# Patient Record
Sex: Female | Born: 1950 | Race: Black or African American | Hispanic: No | Marital: Single | Smoking: Never smoker
Health system: Southern US, Community
[De-identification: ages and names within clinical notes are randomized; demographics above are authoritative.]

## PROBLEM LIST (undated history)

## (undated) DIAGNOSIS — I10 Essential (primary) hypertension: Secondary | ICD-10-CM

## (undated) DIAGNOSIS — E119 Type 2 diabetes mellitus without complications: Secondary | ICD-10-CM

## (undated) HISTORY — PX: WISDOM TOOTH EXTRACTION: SHX21

## (undated) HISTORY — DX: Essential (primary) hypertension: I10

## (undated) HISTORY — PX: VAGINAL HYSTERECTOMY: SUR661

## (undated) HISTORY — PX: FOOT SURGERY: SHX648

## (undated) HISTORY — DX: Type 2 diabetes mellitus without complications: E11.9

---

## 2011-12-16 DIAGNOSIS — I10 Essential (primary) hypertension: Secondary | ICD-10-CM | POA: Insufficient documentation

## 2011-12-16 DIAGNOSIS — E119 Type 2 diabetes mellitus without complications: Secondary | ICD-10-CM | POA: Insufficient documentation

## 2012-06-15 ENCOUNTER — Encounter: Payer: BC Managed Care – PPO | Admitting: Obstetrics and Gynecology

## 2012-07-19 ENCOUNTER — Ambulatory Visit: Payer: BC Managed Care – PPO | Admitting: Obstetrics and Gynecology

## 2012-07-19 ENCOUNTER — Encounter: Payer: Self-pay | Admitting: Obstetrics and Gynecology

## 2012-07-19 VITALS — BP 122/62 | Ht 66.0 in | Wt 128.0 lb

## 2012-07-19 DIAGNOSIS — E559 Vitamin D deficiency, unspecified: Secondary | ICD-10-CM | POA: Insufficient documentation

## 2012-07-19 DIAGNOSIS — Z124 Encounter for screening for malignant neoplasm of cervix: Secondary | ICD-10-CM

## 2012-07-19 DIAGNOSIS — Z139 Encounter for screening, unspecified: Secondary | ICD-10-CM

## 2012-07-19 LAB — VITAMIN D 25 HYDROXY (VIT D DEFICIENCY, FRACTURES): Vit D, 25-Hydroxy: 26 ng/mL — ABNORMAL LOW (ref 30–89)

## 2012-07-19 NOTE — Progress Notes (Signed)
Contraception Hysterectomy Last pap per pt 10/01/2010 Normal Last Mammo  02/12/2010  Normal Last Colonoscopy per pt 10/2010 Last Dexa Scan  2011 was told Vitamin D Deficiency  Primary MD none  Abuse at Home No    Filed Vitals:   07/19/12 0918  BP: 122/62   ROS: noncontributory  Physical Examination: General appearance - alert, well appearing, and in no distress, many moles Neck - supple, no significant adenopathy Chest - clear to auscultation, no wheezes, rales or rhonchi, symmetric air entry Heart - normal rate and regular rhythm Abdomen - soft, nontender, nondistended, no masses or organomegaly Breasts - breasts appear normal, no suspicious masses, no skin or nipple changes or axillary nodes Pelvic - normal external genitalia, vulva, atrophic vagina and adnexa, slight d/c Back exam - no CVAT Rectal - no masses Extremities - no edema, redness or tenderness in the calves or thighs  A/P mammo Dexa - today Check vit D Pap today - no further paps needed (reports hyst for benign reasons) Pt follows her moles and instructions given as well for any changes Refer to Dr. Allyne Gee - Pt needs a Prim MD Wet prep neg

## 2012-08-04 ENCOUNTER — Telehealth: Payer: Self-pay

## 2012-08-26 ENCOUNTER — Ambulatory Visit: Payer: Self-pay | Admitting: Internal Medicine

## 2013-05-20 ENCOUNTER — Other Ambulatory Visit: Payer: Self-pay | Admitting: Internal Medicine

## 2013-05-20 DIAGNOSIS — M5430 Sciatica, unspecified side: Secondary | ICD-10-CM

## 2013-05-30 ENCOUNTER — Other Ambulatory Visit: Payer: Self-pay

## 2013-06-06 ENCOUNTER — Ambulatory Visit
Admission: RE | Admit: 2013-06-06 | Discharge: 2013-06-06 | Disposition: A | Discharge status: Home / Self Care | Payer: BC Managed Care – PPO | Source: Ambulatory Visit | Attending: Internal Medicine | Admitting: Internal Medicine

## 2013-06-06 DIAGNOSIS — M5430 Sciatica, unspecified side: Secondary | ICD-10-CM

## 2013-07-28 ENCOUNTER — Other Ambulatory Visit: Payer: Self-pay | Admitting: Obstetrics and Gynecology

## 2013-08-03 ENCOUNTER — Ambulatory Visit (HOSPITAL_BASED_OUTPATIENT_CLINIC_OR_DEPARTMENT_OTHER)
Admission: RE | Admit: 2013-08-03 | Discharge: 2013-08-03 | Disposition: A | Discharge status: Home / Self Care | Payer: BC Managed Care – PPO | Source: Ambulatory Visit | Attending: Obstetrics and Gynecology | Admitting: Obstetrics and Gynecology

## 2013-08-03 DIAGNOSIS — Z139 Encounter for screening, unspecified: Secondary | ICD-10-CM

## 2013-08-03 DIAGNOSIS — Z1231 Encounter for screening mammogram for malignant neoplasm of breast: Secondary | ICD-10-CM | POA: Insufficient documentation

## 2013-09-29 ENCOUNTER — Ambulatory Visit (INDEPENDENT_AMBULATORY_CARE_PROVIDER_SITE_OTHER): Payer: BC Managed Care – PPO | Admitting: General Surgery

## 2013-10-05 ENCOUNTER — Encounter (HOSPITAL_BASED_OUTPATIENT_CLINIC_OR_DEPARTMENT_OTHER): Payer: Self-pay | Admitting: Emergency Medicine

## 2013-10-05 ENCOUNTER — Emergency Department (HOSPITAL_BASED_OUTPATIENT_CLINIC_OR_DEPARTMENT_OTHER)
Admission: EM | Admit: 2013-10-05 | Discharge: 2013-10-05 | Disposition: A | Discharge status: Home / Self Care | Payer: BC Managed Care – PPO | Attending: Emergency Medicine | Admitting: Emergency Medicine

## 2013-10-05 DIAGNOSIS — Z79899 Other long term (current) drug therapy: Secondary | ICD-10-CM | POA: Insufficient documentation

## 2013-10-05 DIAGNOSIS — N390 Urinary tract infection, site not specified: Secondary | ICD-10-CM | POA: Insufficient documentation

## 2013-10-05 DIAGNOSIS — R55 Syncope and collapse: Secondary | ICD-10-CM | POA: Insufficient documentation

## 2013-10-05 DIAGNOSIS — E119 Type 2 diabetes mellitus without complications: Secondary | ICD-10-CM | POA: Insufficient documentation

## 2013-10-05 DIAGNOSIS — I10 Essential (primary) hypertension: Secondary | ICD-10-CM | POA: Insufficient documentation

## 2013-10-05 DIAGNOSIS — Z7982 Long term (current) use of aspirin: Secondary | ICD-10-CM | POA: Insufficient documentation

## 2013-10-05 LAB — URINALYSIS, ROUTINE W REFLEX MICROSCOPIC
Bilirubin Urine: NEGATIVE
GLUCOSE, UA: NEGATIVE mg/dL
Hgb urine dipstick: NEGATIVE
Ketones, ur: NEGATIVE mg/dL
Nitrite: NEGATIVE
PH: 7.5 (ref 5.0–8.0)
Protein, ur: NEGATIVE mg/dL
Specific Gravity, Urine: 1.015 (ref 1.005–1.030)
Urobilinogen, UA: 1 mg/dL (ref 0.0–1.0)

## 2013-10-05 LAB — CBC WITH DIFFERENTIAL/PLATELET
BASOS ABS: 0 10*3/uL (ref 0.0–0.1)
BASOS PCT: 0 % (ref 0–1)
Eosinophils Absolute: 0.1 10*3/uL (ref 0.0–0.7)
Eosinophils Relative: 1 % (ref 0–5)
HCT: 39.4 % (ref 36.0–46.0)
Hemoglobin: 13.2 g/dL (ref 12.0–15.0)
LYMPHS PCT: 21 % (ref 12–46)
Lymphs Abs: 1 10*3/uL (ref 0.7–4.0)
MCH: 29.1 pg (ref 26.0–34.0)
MCHC: 33.5 g/dL (ref 30.0–36.0)
MCV: 86.8 fL (ref 78.0–100.0)
Monocytes Absolute: 0.5 10*3/uL (ref 0.1–1.0)
Monocytes Relative: 11 % (ref 3–12)
NEUTROS PCT: 67 % (ref 43–77)
Neutro Abs: 3 10*3/uL (ref 1.7–7.7)
Platelets: 129 10*3/uL — ABNORMAL LOW (ref 150–400)
RBC: 4.54 MIL/uL (ref 3.87–5.11)
RDW: 15.2 % (ref 11.5–15.5)
WBC: 4.5 10*3/uL (ref 4.0–10.5)

## 2013-10-05 LAB — BASIC METABOLIC PANEL
BUN: 14 mg/dL (ref 6–23)
CALCIUM: 9.4 mg/dL (ref 8.4–10.5)
CO2: 25 mEq/L (ref 19–32)
CREATININE: 0.9 mg/dL (ref 0.50–1.10)
Chloride: 99 mEq/L (ref 96–112)
GFR, EST AFRICAN AMERICAN: 78 mL/min — AB (ref >90)
GFR, EST NON AFRICAN AMERICAN: 67 mL/min — AB (ref >90)
Glucose, Bld: 250 mg/dL — ABNORMAL HIGH (ref 70–99)
Potassium: 4.2 mEq/L (ref 3.7–5.3)
Sodium: 137 mEq/L (ref 137–147)

## 2013-10-05 LAB — CBG MONITORING, ED: Glucose-Capillary: 226 mg/dL — ABNORMAL HIGH (ref 70–99)

## 2013-10-05 LAB — URINE MICROSCOPIC-ADD ON

## 2013-10-05 MED ORDER — SULFAMETHOXAZOLE-TRIMETHOPRIM 800-160 MG PO TABS: 1.0000 | ORAL_TABLET | Freq: Two times a day (BID) | ORAL | Status: DC

## 2013-10-05 MED ORDER — SODIUM CHLORIDE 0.9 % IV BOLUS (SEPSIS)
1000.0000 mL | Freq: Once | INTRAVENOUS | Status: AC
  Administered 2013-10-05: 1000 mL via INTRAVENOUS

## 2013-10-05 MED ORDER — SULFAMETHOXAZOLE-TMP DS 800-160 MG PO TABS
1.0000 | ORAL_TABLET | Freq: Once | ORAL | Status: AC
  Administered 2013-10-05: 1 via ORAL
  Filled 2013-10-05: qty 1

## 2013-10-05 NOTE — ED Notes (Signed)
Pt recently dx with sinus infection, started on tussionex yesterday, no appetite, abdominal pain "hurts all over" reports that this am she awoke around 0200 with dizziness, got up to go to bathroom, last balance and fell, family member heard fall and came to help

## 2013-10-05 NOTE — ED Provider Notes (Signed)
CSN: [id number]     Arrival date & time 10/05/13  0425 History   First MD Initiated Contact with Patient 10/05/13 [phone]     Chief Complaint  Patient presents with  . Syncope      (Consider location/radiation/quality/duration/timing/severity/associated sxs/prior Treatment) HPI This is a 63 year old female with history of diabetes. She was seen by her doctor yesterday for cough and nasal congestion. She was prescribed Tussionex. She took her first dose of Tussionex yesterday evening about 9 PM. Her appetite has been poor the only thing she ate yesterday was 2 cucumbers. She got up this morning to go to the bathroom and her family heard her fall; family member states this was about 3:45AM. The patient thinks she fell twice and ended up leaning against the wall. She denies injury. Her family states that she was clammy, and when she came to she felt dizzy and was hurting all over. She states these symptoms are consistent with previous episodes of hypoglycemia. She was given some peanut butter crackers to eat. Her family checked her sugar about 10 minutes later and found it to be 178. On arrival here she was complaining of continued dizziness, nausea, generalized aches and abdominal pain. By the time of my evaluation these symptoms had resolved and she complains only of feeling tired. Her sugar was noted to be 226 on arrival. Her systolic blood pressure did drop from the 140s to the 100s on standing.   Past Medical History  Diagnosis Date  . Hypertension   . Diabetes mellitus without complication    Past Surgical History  Procedure Laterality Date  . Wisdom tooth extraction    . Foot surgery    . Vaginal hysterectomy     Family History  Problem Relation Age of Onset  . Diabetes Father   . Heart failure Father   . Hypertension Father    History  Substance Use Topics  . Smoking status: Never Smoker   . Smokeless tobacco: Never Used  . Alcohol Use: 0.6 oz/week    1 Glasses of wine per week      Comment: 1 glass of wine    OB History   Grav Para Term Preterm Abortions TAB SAB Ect Mult Living   2 2 2       2      Review of Systems  All other systems reviewed and are negative.   Allergies  Review of patient's allergies indicates no known allergies.  Home Medications   Prior to Admission medications   Medication Sig Start Date End Date Taking? Authorizing Provider  aspirin 81 MG tablet Take 81 mg by mouth daily.   Yes Historical Provider, MD  metoprolol succinate (TOPROL-XL) 25 MG 24 hr tablet Take 25 mg by mouth daily.   Yes Historical Provider, MD  Multiple Vitamin (MULTIVITAMIN) tablet Take 1 tablet by mouth daily.   Yes Historical Provider, MD  pioglitazone (ACTOS) 15 MG tablet Take 15 mg by mouth daily.   Yes Historical Provider, MD  sitaGLIPtin (JANUVIA) 100 MG tablet Take 100 mg by mouth daily.   Yes Historical Provider, MD   BP 107/67  Pulse 72  Temp(Src) 98.5 F (36.9 C) (Oral)  Resp 20  Ht 5\' 7"  (1.702 m)  Wt 128 lb (58.06 kg)  BMI 20.04 kg/m2  SpO2 99%  Physical Exam General: Well-developed, well-nourished female in no acute distress; appearance consistent with age of record HENT: normocephalic; atraumatic Eyes: pupils equal, round and reactive to light; extraocular muscles intact Neck:  supple Heart: regular rate and rhythm Lungs: clear to auscultation bilaterally Abdomen: soft; nondistended; nontender; no masses or hepatosplenomegaly; bowel sounds present Extremities: No deformity; full range of motion; pulses normal; no Neurologic: Awake, alert and oriented; motor function intact in all extremities and symmetric; no facial droop Skin: Warm and dry Psychiatric: Normal mood and affect    ED Course  Procedures (including critical care time)   MDM   Nursing notes and vitals signs, including pulse oximetry, reviewed.  Summary of this visit's results, reviewed by myself:  Labs:  Results for orders placed during the hospital encounter of  10/05/13 (from the past 24 hour(s))  CBG MONITORING, ED     Status: Abnormal   Collection Time    10/05/13  4:36 AM      Result Value Ref Range   Glucose-Capillary 226 (*) 70 - 99 mg/dL  URINALYSIS, ROUTINE W REFLEX MICROSCOPIC     Status: Abnormal   Collection Time    10/05/13  4:40 AM      Result Value Ref Range   Color, Urine YELLOW  YELLOW   APPearance CLOUDY (*) CLEAR   Specific Gravity, Urine 1.015  1.005 - 1.030   pH 7.5  5.0 - 8.0   Glucose, UA NEGATIVE  NEGATIVE mg/dL   Hgb urine dipstick NEGATIVE  NEGATIVE   Bilirubin Urine NEGATIVE  NEGATIVE   Ketones, ur NEGATIVE  NEGATIVE mg/dL   Protein, ur NEGATIVE  NEGATIVE mg/dL   Urobilinogen, UA 1.0  0.0 - 1.0 mg/dL   Nitrite NEGATIVE  NEGATIVE   Leukocytes, UA MODERATE (*) NEGATIVE  URINE MICROSCOPIC-ADD ON     Status: Abnormal   Collection Time    10/05/13  4:40 AM      Result Value Ref Range   Squamous Epithelial / LPF FEW (*) RARE   WBC, UA 7-10  <3 WBC/hpf   RBC / HPF 0-2  <3 RBC/hpf   Bacteria, UA FEW (*) RARE   Urine-Other AMORPHOUS URATES/PHOSPHATES    BASIC METABOLIC PANEL     Status: Abnormal   Collection Time    10/05/13  5:18 AM      Result Value Ref Range   Sodium 137  137 - 147 mEq/L   Potassium 4.2  3.7 - 5.3 mEq/L   Chloride 99  96 - 112 mEq/L   CO2 25  19 - 32 mEq/L   Glucose, Bld 250 (*) 70 - 99 mg/dL   BUN 14  6 - 23 mg/dL   Creatinine, Ser 0.90  0.50 - 1.10 mg/dL   Calcium 9.4  8.4 - 10.5 mg/dL   GFR calc non Af Amer 67 (*) >90 mL/min   GFR calc Af Amer 78 (*) >90 mL/min  CBC WITH DIFFERENTIAL     Status: Abnormal   Collection Time    10/05/13  5:18 AM      Result Value Ref Range   WBC 4.5  4.0 - 10.5 K/uL   RBC 4.54  3.87 - 5.11 MIL/uL   Hemoglobin 13.2  12.0 - 15.0 g/dL   HCT 39.4  36.0 - 46.0 %   MCV 86.8  78.0 - 100.0 fL   MCH 29.1  26.0 - 34.0 pg   MCHC 33.5  30.0 - 36.0 g/dL   RDW 15.2  11.5 - 15.5 %   Platelets 129 (*) 150 - 400 K/uL   Neutrophils Relative % 67  43 - 77 %    Neutro Abs 3.0  1.7 -  7.7 K/uL   Lymphocytes Relative 21  12 - 46 %   Lymphs Abs 1.0  0.7 - 4.0 K/uL   Monocytes Relative 11  3 - 12 %   Monocytes Absolute 0.5  0.1 - 1.0 K/uL   Eosinophils Relative 1  0 - 5 %   Eosinophils Absolute 0.1  0.0 - 0.7 K/uL   Basophils Relative 0  0 - 1 %   Basophils Absolute 0.0  0.0 - 0.1 K/uL   5:55 AM Patient feeling much better after IV fluid bolus. She is able to ambulate around the department without weakness or dizziness. Suspect syncopal episode was due to hypoglycemia as the patient ate little yesterday but did take her antihyperglycemic medications. Symptoms have resolved, EKG and labs normal except for Glucose. She was advised to eat breakfast after discharge and return to a normal diet as best she can. She is due to leave on a cruise to [place] in two days. We will treat her UTI.     Wynetta Fines, MD 10/05/13 [phone]

## 2013-10-06 LAB — URINE CULTURE: Colony Count: 15000

## 2013-10-07 ENCOUNTER — Encounter (HOSPITAL_BASED_OUTPATIENT_CLINIC_OR_DEPARTMENT_OTHER): Payer: Self-pay | Admitting: Emergency Medicine

## 2013-10-07 ENCOUNTER — Emergency Department (HOSPITAL_BASED_OUTPATIENT_CLINIC_OR_DEPARTMENT_OTHER)
Admission: EM | Admit: 2013-10-07 | Discharge: 2013-10-07 | Disposition: A | Discharge status: Home / Self Care | Payer: BC Managed Care – PPO | Attending: Emergency Medicine | Admitting: Emergency Medicine

## 2013-10-07 ENCOUNTER — Emergency Department (HOSPITAL_BASED_OUTPATIENT_CLINIC_OR_DEPARTMENT_OTHER): Payer: BC Managed Care – PPO

## 2013-10-07 DIAGNOSIS — Z7982 Long term (current) use of aspirin: Secondary | ICD-10-CM | POA: Insufficient documentation

## 2013-10-07 DIAGNOSIS — I10 Essential (primary) hypertension: Secondary | ICD-10-CM | POA: Insufficient documentation

## 2013-10-07 DIAGNOSIS — J209 Acute bronchitis, unspecified: Secondary | ICD-10-CM | POA: Insufficient documentation

## 2013-10-07 DIAGNOSIS — E119 Type 2 diabetes mellitus without complications: Secondary | ICD-10-CM | POA: Insufficient documentation

## 2013-10-07 DIAGNOSIS — Z79899 Other long term (current) drug therapy: Secondary | ICD-10-CM | POA: Insufficient documentation

## 2013-10-07 DIAGNOSIS — J40 Bronchitis, not specified as acute or chronic: Secondary | ICD-10-CM

## 2013-10-07 MED ORDER — BENZONATATE 100 MG PO CAPS: 100.0000 mg | ORAL_CAPSULE | Freq: Three times a day (TID) | ORAL | Status: DC

## 2013-10-07 MED ORDER — ALBUTEROL SULFATE HFA 108 (90 BASE) MCG/ACT IN AERS: 1.0000 | INHALATION_SPRAY | Freq: Four times a day (QID) | RESPIRATORY_TRACT | Status: DC | PRN

## 2013-10-07 MED ORDER — AZITHROMYCIN 250 MG PO TABS: ORAL_TABLET | ORAL | Status: DC

## 2013-10-07 MED ORDER — PREDNISONE 50 MG PO TABS
60.0000 mg | ORAL_TABLET | Freq: Once | ORAL | Status: AC
  Administered 2013-10-07: 60 mg via ORAL
  Filled 2013-10-07 (×2): qty 1

## 2013-10-07 MED ORDER — PREDNISONE 10 MG PO TABS: 20.0000 mg | ORAL_TABLET | Freq: Every day | ORAL | Status: DC

## 2013-10-07 NOTE — Discharge Instructions (Signed)

## 2013-10-07 NOTE — ED Provider Notes (Signed)
CSN: [id number]     Arrival date & time 10/07/13  2021 History  This chart was scribed for Leota Jacobsen, MD by Maree Erie, ED Scribe. The patient was seen in room MH08/MH08. Patient's care was started at 9:50 PM.     Chief Complaint  Patient presents with  . Cough      Patient is a 63 y.o. female presenting with cough. The history is provided by the patient. No language interpreter was used.  Cough Associated symptoms: no fever     HPI Comments: Amy Foley is a 63 y.o. female who presents to the Emergency Department complaining of a constant cough that began about a week ago. She states that the cough became productive with yellow sputum today. She was seen Tuesday for the cough and was given Tussinex, which she has been taking without relief. She reports sick contacts in her family that just got over similar symptoms.  She denies vomiting or diarrhea. Sx persistent and nothing makes them better or worse   Past Medical History  Diagnosis Date  . Hypertension   . Diabetes mellitus without complication    Past Surgical History  Procedure Laterality Date  . Wisdom tooth extraction    . Foot surgery    . Vaginal hysterectomy     Family History  Problem Relation Age of Onset  . Diabetes Father   . Heart failure Father   . Hypertension Father    History  Substance Use Topics  . Smoking status: Never Smoker   . Smokeless tobacco: Never Used  . Alcohol Use: 0.6 oz/week    1 Glasses of wine per week     Comment: 1 glass of wine    OB History   Grav Para Term Preterm Abortions TAB SAB Ect Mult Living   2 2 2       2      Review of Systems  Constitutional: Negative for fever.  Respiratory: Positive for cough.   All other systems reviewed and are negative.     Allergies  Tussionex pennkinetic er  Home Medications   Prior to Admission medications   Medication Sig Start Date End Date Taking? Authorizing Provider  aspirin 81 MG tablet Take 81 mg by  mouth daily.    Historical Provider, MD  metoprolol succinate (TOPROL-XL) 25 MG 24 hr tablet Take 25 mg by mouth daily.    Historical Provider, MD  Multiple Vitamin (MULTIVITAMIN) tablet Take 1 tablet by mouth daily.    Historical Provider, MD  pioglitazone (ACTOS) 15 MG tablet Take 15 mg by mouth daily.    Historical Provider, MD  sitaGLIPtin (JANUVIA) 100 MG tablet Take 100 mg by mouth daily.    Historical Provider, MD  sulfamethoxazole-trimethoprim (SEPTRA DS) 800-160 MG per tablet Take 1 tablet by mouth every 12 (twelve) hours. 10/05/13   Karen Chafe Molpus, MD   Triage Vitals: BP 154/83  Pulse 79  Temp(Src) 97.8 F (36.6 C) (Oral)  Resp 20  Ht 5\' 7"  (1.702 m)  Wt 126 lb (57.153 kg)  BMI 19.73 kg/m2  SpO2 100%  Physical Exam  Nursing note and vitals reviewed. Constitutional: She is oriented to person, place, and time. She appears well-developed and well-nourished.  Non-toxic appearance.  HENT:  Head: Normocephalic and atraumatic.  Eyes: Conjunctivae are normal. Pupils are equal, round, and reactive to light.  Neck: Normal range of motion.  Cardiovascular: Normal rate.   Pulmonary/Chest: No respiratory distress. She has no wheezes. She has no rales.  Increased end expiratory phase.   Neurological: She is alert and oriented to person, place, and time.  Skin: Skin is warm and dry.  Psychiatric: She has a normal mood and affect.    ED Course  Procedures (including critical care time)  DIAGNOSTIC STUDIES: Oxygen Saturation is 100% on room air, normal by my interpretation.    COORDINATION OF CARE: 9:51 PM - Patient verbalizes understanding and agrees with treatment plan.    Labs Review Labs Reviewed - No data to display  Imaging Review Dg Chest 2 View  10/07/2013   CLINICAL DATA:  Cough  EXAM: CHEST  2 VIEW  COMPARISON:  None.  FINDINGS: The lungs are clear. Heart size and pulmonary vascularity are normal. No pneumothorax. No adenopathy. No bone lesions.  IMPRESSION: No  abnormality noted.   Electronically Signed   By: Lowella Grip M.D.   On: 10/07/2013 20:43     EKG Interpretation None      MDM   Final diagnoses:  None    I personally performed the services described in this documentation, which was scribed in my presence. The recorded information has been reviewed and is accurate.     Leota Jacobsen, MD 10/07/13 2159

## 2013-10-07 NOTE — ED Notes (Signed)
Cough x 1 week. Productive with yellow sputum. She was seen by her MD Tuesday and given Tussinex.

## 2013-10-20 ENCOUNTER — Ambulatory Visit (INDEPENDENT_AMBULATORY_CARE_PROVIDER_SITE_OTHER): Payer: BC Managed Care – PPO | Admitting: General Surgery

## 2013-10-20 ENCOUNTER — Encounter (INDEPENDENT_AMBULATORY_CARE_PROVIDER_SITE_OTHER): Payer: Self-pay | Admitting: General Surgery

## 2013-10-20 VITALS — BP 126/78 | HR 67 | Temp 98.4°F | Ht 67.0 in | Wt 126.0 lb

## 2013-10-20 DIAGNOSIS — M7989 Other specified soft tissue disorders: Secondary | ICD-10-CM

## 2013-10-20 NOTE — Progress Notes (Signed)
Patient ID: Pamlea Foley, female   DOB: [birth year], 63 y.o.   MRN: [id number]  Chief Complaint  Patient presents with  . eval left axilla    HPI Amy Foley is a 63 y.o. female.  The patient is a 64 year old female who is referred by Dr. Delfina Redwood for evaluation of a left axillary fullness. The patient states that she recognizes that this has been there for approximately 5 years. She states that it has been unchanged. She states she had a full workup previously to include blood work as well as ultrasound and was found to be nonsignificant.  The patient denies any fevers, chills, night sweats, or weight loss.  HPI  Past Medical History  Diagnosis Date  . Hypertension   . Diabetes mellitus without complication     Past Surgical History  Procedure Laterality Date  . Wisdom tooth extraction    . Foot surgery    . Vaginal hysterectomy      Family History  Problem Relation Age of Onset  . Diabetes Father   . Heart failure Father   . Hypertension Father     Social History History  Substance Use Topics  . Smoking status: Never Smoker   . Smokeless tobacco: Never Used  . Alcohol Use: 0.6 oz/week    1 Glasses of wine per week     Comment: 1 glass of wine     Allergies  Allergen Reactions  . Tussionex Pennkinetic Er [Hydrocod Polst-Cpm Polst Er]     Current Outpatient Prescriptions  Medication Sig Dispense Refill  . albuterol (PROVENTIL HFA;VENTOLIN HFA) 108 (90 BASE) MCG/ACT inhaler Inhale 1-2 puffs into the lungs every 6 (six) hours as needed for wheezing or shortness of breath.  1 Inhaler  0  . aspirin 81 MG tablet Take 81 mg by mouth daily.      . benzonatate (TESSALON) 100 MG capsule Take 1 capsule (100 mg total) by mouth every 8 (eight) hours.  21 capsule  0  . metoprolol succinate (TOPROL-XL) 25 MG 24 hr tablet Take 25 mg by mouth daily.      . Multiple Vitamin (MULTIVITAMIN) tablet Take 1 tablet by mouth daily.      . pioglitazone (ACTOS) 15 MG  tablet Take 15 mg by mouth daily.      . sitaGLIPtin (JANUVIA) 100 MG tablet Take 100 mg by mouth daily.      Marland Kitchen sulfamethoxazole-trimethoprim (SEPTRA DS) 800-160 MG per tablet Take 1 tablet by mouth every 12 (twelve) hours.  10 tablet  0   No current facility-administered medications for this visit.    Review of Systems Review of Systems  Constitutional: Negative.  Negative for fever, chills, fatigue and unexpected weight change.  HENT: Negative.   Respiratory: Negative.   Cardiovascular: Negative.   Gastrointestinal: Negative.   Neurological: Negative.   All other systems reviewed and are negative.   Blood pressure 126/78, pulse 67, temperature 98.4 F (36.9 C), height 5\' 7"  (1.702 m), weight 126 lb (57.153 kg).  Physical Exam Physical Exam  Constitutional: She is oriented to person, place, and time. She appears well-developed and well-nourished.  HENT:  Head: Normocephalic and atraumatic.  Eyes: Conjunctivae and EOM are normal. Pupils are equal, round, and reactive to light.  Neck: Normal range of motion. Neck supple.  Cardiovascular: Normal rate, regular rhythm and normal heart sounds.   Pulmonary/Chest: Effort normal and breath sounds normal.  Abdominal: Soft. Bowel sounds are normal.  Musculoskeletal: Normal range of motion.  Lymphadenopathy:  She has axillary adenopathy (left, mobile, non ttp, ).  Neurological: She is alert and oriented to person, place, and time.  Skin: Skin is warm and dry.  Psychiatric: She has a normal mood and affect.    Data Reviewed none  Assessment    63 year old female with left axillary fullness, likely lymph nodes. The patient recently underwent a mammogram which was negative. The patient's had no weight loss and repeat type symptoms and I am less worried about any type of lymphoma on this patient. The patient's that she's been doing some weightlifting imposes potentially be related to that. The patient states she had an ultrasound  approximately 5 years ago and is to try to get the records of this. It would probably benefit the patient to have a followup ultrasound to continue to monitor the left axillary fullness.     Plan    1. The patient follow up as needed. 2. We can review the results with the patient faxes his records to [place] from the outside clinic. 3. Either myself or Dr. Delfina Redwood or I can order an ultrasound to compare to previous ultrasound, and the size of the axillary fullness.        Rosario Jacks., Armando 10/20/2013, 10:09 AM

## 2013-10-21 ENCOUNTER — Telehealth (INDEPENDENT_AMBULATORY_CARE_PROVIDER_SITE_OTHER): Payer: Self-pay

## 2013-10-21 NOTE — Telephone Encounter (Signed)
Office Notes from Dr. Rosendo Gros on 10/20/13 faxed to Dr. Delfina Redwood @ [phone].

## 2014-03-06 ENCOUNTER — Encounter (INDEPENDENT_AMBULATORY_CARE_PROVIDER_SITE_OTHER): Payer: Self-pay | Admitting: General Surgery

## 2014-04-14 ENCOUNTER — Encounter: Payer: Self-pay | Admitting: Podiatrist

## 2014-04-14 ENCOUNTER — Ambulatory Visit (INDEPENDENT_AMBULATORY_CARE_PROVIDER_SITE_OTHER): Payer: BC Managed Care – PPO | Admitting: Podiatrist

## 2014-04-14 VITALS — BP 122/61 | HR 80 | Resp 16

## 2014-04-14 DIAGNOSIS — Q828 Other specified congenital malformations of skin: Secondary | ICD-10-CM

## 2014-04-14 NOTE — Patient Instructions (Signed)
Salicylic Acid topical gel, cream, lotion, solution What is this medicine? SALICYCLIC ACID (SAL i SIL ik AS id) breaks down layers of thick skin. It is used to treat common and plantar warts, psoriasis, calluses, and corns. It is also used to treat or to prevent acne. This medicine may be used for other purposes; ask your health care provider or pharmacist if you have questions. COMMON BRAND NAME(S): [place] Jabs, Clear Away Liquid, Compound W, Corn/Callus Remover, Dermarest Psoriasis Moisturizer, Dermarest Psoriasis Overnight Treatment, Dermarest Psoriasis Scalp Treatment, Dermarest Psoriasis Skin Treatment, DuoFilm Wart Remover, [place]ofilm, Hydrisalic, Keralyt, Neutrogena Acne Wash, [place], RE SA, SalAC, [employer], [place], [place], [place], [place] 2 in 1 Anti-Dandruff, [place], [place], Wart-Off What should I tell my health care provider before I take this medicine? They need to know if you have any of these conditions: -child with chickenpox, the flu, or other viral infection -kidney disease -liver disease -an unusual or allergic reaction to salicylic acid, other medicines, foods, dyes, or preservatives -pregnant or trying to get pregnant -breast-feeding How should I use this medicine? This medicine is for external use only. Follow the directions on the label. Do not apply to raw or irritated skin. Avoid getting medicine in your eyes, lips, nose, mouth, or other sensitive areas. Use this medicine at regular intervals. Do not use more often than directed. Talk to your pediatrician regarding the use of this medicine in children. Special care may be needed. This medicine is not approved for use in children under 10 years old. Overdosage: If you think you have taken too much of this medicine contact a poison control center or emergency room at once. NOTE: This medicine is only for you. Do not share this medicine with others. What if I miss a dose? If you miss a dose, use it as soon as you  can. If it is almost time for your next dose, use only that dose. Do not use double or extra doses. What may interact with this medicine? -medicines that change urine pH like ammonium chloride, sodium bicarbonate, and others -medicines that treat or prevent blood clots like warfarin -methotrexate -pyrazinamide -some medicines for diabetes -some medicines for gout -steroid medicines like prednisone or cortisone This list may not describe all possible interactions. Give your health care provider a list of all the medicines, herbs, non-prescription drugs, or dietary supplements you use. Also tell them if you smoke, drink alcohol, or use illegal drugs. Some items may interact with your medicine. What should I watch for while using this medicine? Tell your doctor is your symptoms do not get better or if they get worse. This medicine can make you more sensitive to the sun. Keep out of the sun. If you cannot avoid being in the sun, wear protective clothing and use sunscreen. Do not use sun lamps or tanning beds/booths. Use of this medicine in children under 12 years or in patients with kidney or liver disease may increase the risk of serious side effects. These patients should not use this medicine over large areas of skin. If you notice symptoms such as nausea, vomiting, dizziness, loss of hearing, ringing in the ears, unusual weakness or tiredness, fast or labored breathing, diarrhea, or confusion, stop using this medicine and contact your doctor or health care professional. What side effects may I notice from receiving this medicine? Side effects that you should report to your doctor or health care professional as soon as possible: -allergic reactions like skin rash, itching or hives, swelling of the face,  lips, or tongue Side effects that usually do not require medical attention (report to your doctor or health care professional if they continue or are bothersome): -skin irritation This list may not  describe all possible side effects. Call your doctor for medical advice about side effects. You may report side effects to FDA at 1-800-FDA-1088. Where should I keep my medicine? Keep out of the reach of children. Store at room temperature between 15 and 30 degrees C (59 and 86 degrees F). Do not freeze. Throw away any unused medicine after the expiration date. NOTE: This sheet is a summary. It may not cover all possible information. If you have questions about this medicine, talk to your doctor, pharmacist, or health care provider.  2015, [employer]vier/Gold Standard. (2007-12-24 13:36:20)

## 2014-04-24 NOTE — Progress Notes (Signed)
Chief Complaint  Patient presents with  . Foot Pain    Plantar forefoot left   "I might have a plantar wart or callus"   LOV 2013     HPI: Patient is 63 y.o. female who presents today for a painful lesion at the forefoot of the left foot. Patient is diabetic and the area makes it difficult to walk   Allergies  Allergen Reactions  . Tussionex Pennkinetic Er [Hydrocod Polst-Cpm Polst Er]     Physical Exam  Patient is awake, alert, and oriented x 3.  In no acute distress.  Vascular status is intact with palpable pedal pulses at 2/4 DP and PT bilateral and capillary refill time within normal limits. Neurological sensation is also intact bilaterally via Semmes Weinstein monofilament at 5/5 sites. Light touch, vibratory sensation, Achilles tendon reflex is intact. Dermatological exam reveals skin color, turger and texture as normal. No open lesions present.  Musculature intact with dorsiflexion, plantarflexion, inversion, eversion.  Well circumscribed lesion is present to the plantar forefoot of the left foot. 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 No site of ulceration is present.  Assessment: Porokeratosis  Plan: Deep debridement of the porokeratotic lesion is accomplished today. It was packed with salinocaine and offloaded. She'll be seen back as needed for follow-up and will call if any concerns or problems arise.

## 2014-08-25 ENCOUNTER — Encounter: Payer: Self-pay | Admitting: Podiatrist

## 2014-08-25 ENCOUNTER — Ambulatory Visit (INDEPENDENT_AMBULATORY_CARE_PROVIDER_SITE_OTHER): Payer: BC Managed Care – PPO | Admitting: Podiatrist

## 2014-08-25 VITALS — BP 134/75 | HR 85 | Resp 12

## 2014-08-25 DIAGNOSIS — M216X2 Other acquired deformities of left foot: Secondary | ICD-10-CM | POA: Diagnosis not present

## 2014-08-25 DIAGNOSIS — Q828 Other specified congenital malformations of skin: Secondary | ICD-10-CM | POA: Diagnosis not present

## 2014-08-25 NOTE — Patient Instructions (Signed)
Corns and Calluses Corns are small areas of thickened skin that usually occur on the top, sides, or tip of a toe. They contain a cone-shaped core with a point that can press on a nerve below. This causes pain. Calluses are areas of thickened skin that usually develop on hands, fingers, palms, soles of the feet, and heels. These are areas that experience frequent friction or pressure. CAUSES  Corns are usually the result of rubbing (friction) or pressure from shoes that are too tight or do not fit properly. Calluses are caused by repeated friction and pressure on the affected areas. SYMPTOMS  A hard growth on the skin.  Pain or tenderness under the skin.  Sometimes, redness and swelling.  Increased discomfort while wearing tight-fitting shoes. DIAGNOSIS  Your caregiver can usually tell what the problem is by doing a physical exam. TREATMENT  Removing the cause of the friction or pressure is usually the only treatment needed. However, sometimes medicines can be used to help soften the hardened, thickened areas. These medicines include salicylic acid plasters and 12% ammonium lactate lotion. These medicines should only be used under the direction of your caregiver. HOME CARE INSTRUCTIONS   Try to remove pressure from the affected area.  You may wear donut-shaped corn pads to protect your skin.  You may use a pumice stone or nonmetallic nail file to gently reduce the thickness of a corn.  Wear properly fitted footwear.  If you have calluses on the hands, wear gloves during activities that cause friction.  If you have diabetes, you should regularly examine your feet. Tell your caregiver if you notice any problems with your feet. SEEK IMMEDIATE MEDICAL CARE IF:   You have increased pain, swelling, redness, or warmth in the affected area.  Your corn or callus starts to drain fluid or bleeds.  You are not getting better, even with treatment. Document Released: 01/26/2004 Document  Revised: 07/14/2011 Document Reviewed: 12/17/2010 Exit[employer] Patient Information 2015 [employer]. This information is not intended to replace advice given to you by your health care provider. Make sure you discuss any questions you have with your health care provider.  

## 2014-08-25 NOTE — Progress Notes (Signed)
Chief Complaint  Patient presents with  . Foot Pain    "left forefoot is still painful"     HPI: Patient is 64 y.o. female who presents today for a painful lesion at the forefoot of the left foot. Patient is diabetic and the area makes it difficult to walk   Allergies  Allergen Reactions  . Tussionex Pennkinetic Er [Hydrocod Polst-Cpm Polst Er]     Physical Exam  Patient is awake, alert, and oriented x 3.  In no acute distress.  Vascular status is intact with palpable pedal pulses at 2/4 DP and PT bilateral and capillary refill time within normal limits. Neurological sensation is also intact bilaterally via Semmes Weinstein monofilament at 5/5 sites. Light touch, vibratory sensation, Achilles tendon reflex is intact. Dermatological exam reveals skin color, turger and texture as normal. No open lesions present.  Musculature intact with dorsiflexion, plantarflexion, inversion, eversion.  Well circumscribed lesion is present to the plantar forefoot of the left foot. 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 No site of ulceration is present.  Assessment: Porokeratosis  Plan: Deep debridement of the porokeratotic lesion is accomplished today. It was packed with salinocaine and offloaded. She will call 3 weeks before she is intereste d in surgery-- likely she will need a shave or weil of the 3rd metatarsal. xrays will need to be taken

## 2015-04-26 ENCOUNTER — Ambulatory Visit (INDEPENDENT_AMBULATORY_CARE_PROVIDER_SITE_OTHER): Payer: BC Managed Care – PPO | Admitting: Podiatry

## 2015-04-26 ENCOUNTER — Encounter: Payer: Self-pay | Admitting: Podiatry

## 2015-04-26 VITALS — BP 109/61 | HR 77 | Resp 16

## 2015-04-26 DIAGNOSIS — Q828 Other specified congenital malformations of skin: Secondary | ICD-10-CM | POA: Diagnosis not present

## 2015-04-30 NOTE — Progress Notes (Signed)
She presents today with chief complaint of painful calluses bilateral foot. She states is becoming too painful to walk on. She last visited our practice 08/29/2014.  Objective: Vital signs are stable she is alert and oriented 3. Pulses are strongly palpable. Capillary fill time is within normal limits. Normal sensation is intact bilaterally no open lesions or wounds. Porokeratotic lesions are noted forefoot bilateral.  Assessment: Porokeratosis.  Plan: Deep debridement of porokeratotic lesion was accomplished today and I will follow up with her on an as-needed basis.

## 2015-07-06 ENCOUNTER — Other Ambulatory Visit: Payer: Self-pay | Admitting: Internal Medicine

## 2015-07-06 DIAGNOSIS — M7989 Other specified soft tissue disorders: Secondary | ICD-10-CM

## 2015-07-10 ENCOUNTER — Other Ambulatory Visit: Payer: BC Managed Care – PPO

## 2015-07-17 ENCOUNTER — Ambulatory Visit
Admission: RE | Admit: 2015-07-17 | Discharge: 2015-07-17 | Disposition: A | Discharge status: Home / Self Care | Payer: BC Managed Care – PPO | Source: Ambulatory Visit | Attending: Internal Medicine | Admitting: Internal Medicine

## 2015-07-17 DIAGNOSIS — M7989 Other specified soft tissue disorders: Secondary | ICD-10-CM

## 2015-10-25 ENCOUNTER — Ambulatory Visit (INDEPENDENT_AMBULATORY_CARE_PROVIDER_SITE_OTHER): Payer: BC Managed Care – PPO | Admitting: Podiatry

## 2015-10-25 ENCOUNTER — Encounter: Payer: Self-pay | Admitting: Podiatry

## 2015-10-25 DIAGNOSIS — Q828 Other specified congenital malformations of skin: Secondary | ICD-10-CM | POA: Diagnosis not present

## 2015-10-25 DIAGNOSIS — M216X2 Other acquired deformities of left foot: Secondary | ICD-10-CM

## 2015-10-25 NOTE — Progress Notes (Signed)
She presents today with chief complaint of painful calluses plantar aspect of the forefoot. She would also like to have her nails trimmed.  Objective: Vital signs are stable she is alert and oriented 3. Pulses are palpable. Multiple porokeratosis plantar aspect of the bilateral foot with elongated toenails.  Assessment: Pain in limb secondary to porokeratosis and elongated toenails.  Plan: Debridement of all reactive hyperkeratosis and toenails.

## 2016-04-05 ENCOUNTER — Encounter (HOSPITAL_BASED_OUTPATIENT_CLINIC_OR_DEPARTMENT_OTHER): Payer: Self-pay | Admitting: *Deleted

## 2016-04-05 ENCOUNTER — Emergency Department (HOSPITAL_BASED_OUTPATIENT_CLINIC_OR_DEPARTMENT_OTHER)
Admission: EM | Admit: 2016-04-05 | Discharge: 2016-04-05 | Disposition: A | Discharge status: Home / Self Care | Payer: Medicare Other | Attending: Emergency Medicine | Admitting: Emergency Medicine

## 2016-04-05 DIAGNOSIS — E119 Type 2 diabetes mellitus without complications: Secondary | ICD-10-CM | POA: Insufficient documentation

## 2016-04-05 DIAGNOSIS — K0889 Other specified disorders of teeth and supporting structures: Secondary | ICD-10-CM | POA: Insufficient documentation

## 2016-04-05 DIAGNOSIS — Z7982 Long term (current) use of aspirin: Secondary | ICD-10-CM | POA: Diagnosis not present

## 2016-04-05 DIAGNOSIS — I10 Essential (primary) hypertension: Secondary | ICD-10-CM | POA: Diagnosis not present

## 2016-04-05 DIAGNOSIS — Z79899 Other long term (current) drug therapy: Secondary | ICD-10-CM | POA: Diagnosis not present

## 2016-04-05 DIAGNOSIS — Z5321 Procedure and treatment not carried out due to patient leaving prior to being seen by health care provider: Secondary | ICD-10-CM | POA: Insufficient documentation

## 2016-04-05 NOTE — ED Notes (Signed)
RN called to lobby due to pt being at checkout desk. RN spoke with pt who stated she had waited too long and wanted to go home and "sleep it off". RN encouraged patient to stay and be seen. Pt refused.

## 2016-04-05 NOTE — ED Triage Notes (Signed)
Pt was seen by Dentist x 4 days ago for an unknown dental procedure. Present with left facial pain and dental pain

## 2016-04-24 ENCOUNTER — Ambulatory Visit: Payer: BC Managed Care – PPO | Admitting: Podiatry

## 2016-05-13 ENCOUNTER — Ambulatory Visit: Payer: Medicare Other | Admitting: Podiatry

## 2016-06-03 ENCOUNTER — Ambulatory Visit: Payer: Medicare Other | Admitting: Podiatry

## 2016-06-24 ENCOUNTER — Ambulatory Visit: Payer: Medicare Other | Admitting: Podiatry

## 2016-07-03 ENCOUNTER — Ambulatory Visit (INDEPENDENT_AMBULATORY_CARE_PROVIDER_SITE_OTHER): Payer: Medicare Other | Admitting: Podiatry

## 2016-07-03 ENCOUNTER — Encounter: Payer: Self-pay | Admitting: Podiatry

## 2016-07-03 DIAGNOSIS — M216X2 Other acquired deformities of left foot: Secondary | ICD-10-CM

## 2016-07-03 DIAGNOSIS — Q828 Other specified congenital malformations of skin: Secondary | ICD-10-CM | POA: Diagnosis not present

## 2016-07-03 NOTE — Progress Notes (Signed)
She presents today for follow-up of her painful lesion plantar aspect of the left foot. She states that she has taken my advice and started utilizing a barrel roller to help debride the lesion. She states that she's had no pain for the past 6 months.  Objective: Vital signs are stable she is alert and oriented 3 pulses are palpable. Neurologic sensorium is intact. Porokeratotic lesion is intact no erythema edema saline as drainage or odor plantar aspect left foot. No open lesions or wounds.  Assessment: Diabetes mellitus with porokeratosis.  Plan: Debridement of lesion today follow up with her in 6 months

## 2016-09-02 ENCOUNTER — Encounter (INDEPENDENT_AMBULATORY_CARE_PROVIDER_SITE_OTHER): Payer: Medicare Other

## 2016-09-02 DIAGNOSIS — R55 Syncope and collapse: Secondary | ICD-10-CM

## 2016-09-22 ENCOUNTER — Other Ambulatory Visit: Payer: Self-pay | Admitting: Internal Medicine

## 2016-09-22 DIAGNOSIS — R519 Headache, unspecified: Secondary | ICD-10-CM

## 2016-09-22 DIAGNOSIS — R51 Headache: Principal | ICD-10-CM

## 2016-09-24 ENCOUNTER — Ambulatory Visit
Admission: RE | Admit: 2016-09-24 | Discharge: 2016-09-24 | Disposition: A | Discharge status: Home / Self Care | Payer: Medicare Other | Source: Ambulatory Visit | Attending: Internal Medicine | Admitting: Internal Medicine

## 2016-09-24 ENCOUNTER — Other Ambulatory Visit: Payer: Medicare Other

## 2016-09-24 DIAGNOSIS — R519 Headache, unspecified: Secondary | ICD-10-CM

## 2016-09-24 DIAGNOSIS — R51 Headache: Principal | ICD-10-CM

## 2016-10-02 ENCOUNTER — Encounter: Payer: Self-pay | Admitting: Neurology

## 2016-10-20 DIAGNOSIS — Z83511 Family history of glaucoma: Secondary | ICD-10-CM | POA: Insufficient documentation

## 2017-01-14 ENCOUNTER — Encounter: Payer: Self-pay | Admitting: Neurology

## 2017-01-14 ENCOUNTER — Ambulatory Visit (INDEPENDENT_AMBULATORY_CARE_PROVIDER_SITE_OTHER): Payer: Medicare Other | Admitting: Neurology

## 2017-01-14 VITALS — BP 128/68 | HR 72 | Ht 67.0 in | Wt 124.6 lb

## 2017-01-14 DIAGNOSIS — R55 Syncope and collapse: Secondary | ICD-10-CM | POA: Diagnosis not present

## 2017-01-14 DIAGNOSIS — R519 Headache, unspecified: Secondary | ICD-10-CM

## 2017-01-14 DIAGNOSIS — R51 Headache: Secondary | ICD-10-CM | POA: Diagnosis not present

## 2017-01-14 NOTE — Patient Instructions (Signed)
1.  We will get MRI of brain with and without contrast 2.  We will check a routine EEG 3.  Follow up and further recommendations pending results.

## 2017-01-14 NOTE — Progress Notes (Signed)
NEUROLOGY CONSULTATION NOTE  Amy Foley MRN: [id number] DOB: Jan 25, 1951  Referring provider: Dr. Delfina Redwood Primary care provider: Dr. Delfina Redwood  Reason for consult:  headache  HISTORY OF PRESENT ILLNESS: Amy Foley is a 66 year old female with hypertension, hyperlipidemia, diabetes who presents for headache and syncope. History supplemented by PCP note.  She has history of headaches for many years.  They occur along the right posterior temporal region.  They are 6-7/10 intensity and are of varying quality (throbbing, shooting, pressure).  They last 10-20 minutes and occur about once a week.  She notes some mild neck pain but nothing significant.  She denies right posterior head numbness, visual disturbance, nausea, vomiting, photophobia or phonophobia.  Nothing triggers it and nothing specifically relieves it, but duration is short.  Over the past year, she has had 3 episodes of syncope.  Al of these episodes start with a heavy feeling over her body.  In April, she was sitting on her bed when she suddenly felt the room spinning.  She then passed out.  In May, she was at the [employer] when she suddenly felt ill, that "heavy" feeling.  She did not eat that morning and was standing to buy ice cream when she passed out.  She does not remember falling but woke up on the floor.  She was unconscious for a few seconds.  There was no postictal confusion, tongue biting, foaming at mouth, convulsions, bowel or bladder incontinence.  When EMS arrived, she was told that she had a weak pulse.  48 hour Holter monitor in early May revealed normal sinus rhythm between 53 and 124 bpm, average of 75 bpm.  CT of head from 09/24/16 was personally reviewed and showed mild chronic small vessel ischemic changes but no acute intracranial abnormality.  In July, she was in [place].  She was sitting in a [employer] car and was told that she was staring ahead and was not responding.  When she got off the  [place] she felt weak.  She leaned against a wall and then passed out.  She was admitted to the hospital, where she underwent a workup.  An echocardiogram was reportedly unremarkable.  As per the patient, carotid doppler showed some stenosis in the right ICA.  She denies phantosmia.  She sometimes notes sensation of palpitations, however she reports she is very hypervigilant anyway.    She denies personal history of seizures.  She once ran into a brick wall but otherwise denies any history of significant head trauma.  She denies history of meningitis or encephalitis.  She denies family history of seizures.  PAST MEDICAL HISTORY: Past Medical History:  Diagnosis Date  . Diabetes mellitus without complication ([place])   . Hypertension     PAST SURGICAL HISTORY: Past Surgical History:  Procedure Laterality Date  . FOOT SURGERY    . VAGINAL HYSTERECTOMY    . WISDOM TOOTH EXTRACTION      MEDICATIONS: Current Outpatient Prescriptions on File Prior to Visit  Medication Sig Dispense Refill  . aspirin 81 MG tablet Take 81 mg by mouth daily.    . metoprolol succinate (TOPROL-XL) 25 MG 24 hr tablet Take 25 mg by mouth daily.    . Multiple Vitamin (MULTIVITAMIN) tablet Take 1 tablet by mouth daily.    . pioglitazone (ACTOS) 15 MG tablet Take 15 mg by mouth daily.    . sitaGLIPtin (JANUVIA) 100 MG tablet Take 100 mg by mouth daily.     No current facility-administered  medications on file prior to visit.     ALLERGIES: Allergies  Allergen Reactions  . Tussionex Pennkinetic Er [Hydrocod Polst-Cpm Polst Er]     FAMILY HISTORY: Family History  Problem Relation Age of Onset  . Diabetes Father   . Heart failure Father   . Hypertension Father     SOCIAL HISTORY: Social History   Social History  . Marital status: Single    Spouse name: N/A  . Number of children: N/A  . Years of education: N/A   Occupational History  . Not on file.   Social History Main Topics  . Smoking status:  Never Smoker  . Smokeless tobacco: Never Used  . Alcohol use 0.6 oz/week    1 Glasses of wine per week     Comment: 1 glass of wine   . Drug use: No  . Sexual activity: No   Other Topics Concern  . Not on file   Social History Narrative  . No narrative on file    REVIEW OF SYSTEMS: Constitutional: No fevers, chills, or sweats, no generalized fatigue, change in appetite Eyes: No visual changes, double vision, eye pain Ear, nose and throat: No hearing loss, ear pain, nasal congestion, sore throat Cardiovascular: No chest pain, palpitations Respiratory:  No shortness of breath at rest or with exertion, wheezes GastrointestinaI: No nausea, vomiting, diarrhea, abdominal pain, fecal incontinence Genitourinary:  No dysuria, urinary retention or frequency Musculoskeletal:  No neck pain, back pain Integumentary: No rash, pruritus, skin lesions Neurological: as above Psychiatric: No depression, insomnia, anxiety Endocrine: No palpitations, fatigue, diaphoresis, mood swings, change in appetite, change in weight, increased thirst Hematologic/Lymphatic:  No purpura, petechiae. Allergic/Immunologic: no itchy/runny eyes, nasal congestion, recent allergic reactions, rashes  PHYSICAL EXAM: Vitals:   01/14/17 1000  BP: 128/68  Pulse: 72  SpO2: 95%   General: No acute distress.  Patient appears well-groomed.  Head:  Normocephalic/atraumatic Eyes:  fundi examined but not visualized Neck: supple, no paraspinal tenderness, full range of motion Back: No paraspinal tenderness Heart: regular rate and rhythm Lungs: Clear to auscultation bilaterally. Vascular: No carotid bruits. Neurological Exam: Mental status: alert and oriented to person, place, and time, recent and remote memory intact, fund of knowledge intact, attention and concentration intact, speech fluent and not dysarthric, language intact. Cranial nerves: CN I: not tested CN II: pupils equal, round and reactive to light, visual  fields intact CN III, IV, VI:  full range of motion, no nystagmus, no ptosis CN V: facial sensation intact CN VII: upper and lower face symmetric CN VIII: hearing intact CN IX, X: gag intact, uvula midline CN XI: sternocleidomastoid and trapezius muscles intact CN XII: tongue midline Bulk & Tone: normal, no fasciculations. Motor:  5/5 throughout  Sensation:  temperature and vibration sensation intact. Deep Tendon Reflexes:  2+ throughout, toes downgoing.  Finger to nose testing:  Without dysmetria.  Heel to shin:  Without dysmetria.  Gait:  Normal station and stride.  Able to turn and tandem walk. Romberg negative.  IMPRESSION: 1.  Headache, unclear classification.  They have been ongoing for many years.  Possibly cervicogenic, although she denies significant neck pain. 2.  Recurrent loss of conscious.  Semiology more typical of syncope rather than seizure, although the last event was reportedly preceded by a staring spell, which may be epileptic in nature.    PLAN: 1.  We will check MRI of brain and routine EEG 2.  I would also like to obtain records from the hospital in  [place] to review. 3.  Further recommendations and follow up pending results. 4.  Headaches are episodic and of short duration, so no specific medication/preventative recommended at this time.  Thank you for allowing me to take part in the care of this patient.  Metta Clines, DO  CC: Seward Carol, MD

## 2017-01-21 ENCOUNTER — Ambulatory Visit (INDEPENDENT_AMBULATORY_CARE_PROVIDER_SITE_OTHER): Payer: Medicare Other | Admitting: Neurology

## 2017-01-21 DIAGNOSIS — R55 Syncope and collapse: Secondary | ICD-10-CM | POA: Diagnosis not present

## 2017-01-21 NOTE — Procedures (Signed)
ELECTROENCEPHALOGRAM REPORT  Date of Study: 01/21/2017  Patient's Name: Amy Foley MRN: [id number] Date of Birth: 07/17/1950  Clinical History: 66 year old woman with recurrent syncope  Medications: aspirin 81 MG TOPROL-XL MULTIVITAMIN  ACTOS JANUVIA  Technical Summary: A multichannel digital EEG recording measured by the international 10-20 system with electrodes applied with paste and impedances below 5000 ohms performed in our laboratory with EKG monitoring in an awake and drowsy patient.  Hyperventilation and photic stimulation were performed.  The digital EEG was referentially recorded, reformatted, and digitally filtered in a variety of bipolar and referential montages for optimal display.    Description: The patient is awake and drowsy during the recording.  During maximal wakefulness, there is a symmetric, medium voltage 10 Hz posterior dominant rhythm that attenuates with eye opening.  The record is symmetric.  During drowsiness, there is an increase in theta slowing of the background.  Stage 2 sleep was not seen.  Hyperventilation and photic stimulation did not elicit any abnormalities.  There were no epileptiform discharges or electrographic seizures seen.    EKG lead was unremarkable.  Impression: This awake and drowsy EEG is normal.    Clinical Correlation: A normal EEG does not exclude a clinical diagnosis of epilepsy.  If further clinical questions remain, prolonged EEG may be helpful.  Clinical correlation is advised.   Metta Clines, DO

## 2017-01-26 NOTE — Progress Notes (Signed)
LM on VM of normal EEG results, also reminding Pt we have not rcvd hospital notes from [place]

## 2017-01-27 ENCOUNTER — Other Ambulatory Visit: Payer: Medicare Other

## 2017-01-30 ENCOUNTER — Ambulatory Visit
Admission: RE | Admit: 2017-01-30 | Discharge: 2017-01-30 | Disposition: A | Discharge status: Home / Self Care | Payer: Medicare Other | Source: Ambulatory Visit | Attending: Neurology | Admitting: Neurology

## 2017-01-30 ENCOUNTER — Telehealth: Payer: Self-pay | Admitting: Neurology

## 2017-01-30 DIAGNOSIS — R55 Syncope and collapse: Secondary | ICD-10-CM

## 2017-01-30 NOTE — Telephone Encounter (Signed)
Lesly Rubenstein called regarding needing Dr.Jaffe to put a new order in for  Patient's MRI that was scheduled for this morning. She had arrived today and they had to cancel appointment due to her Insulin. She will need it rescheduled next week once it's taken off. Please Call.

## 2017-02-03 NOTE — Telephone Encounter (Signed)
LM for PT to call me if she has not heard from [facility] by end of the day Wednsday, and I will call about getting the pump removed for the MRI

## 2017-02-03 NOTE — Telephone Encounter (Signed)
Called [facility], spoke with Zigmund Daniel, she said we will need to remove the insulin pump before the Pt can have the MRI, advsd her we do not do tha, that her endo or PCP will need to take care of that. She said they will contact Pt to find out who inserted the pump to have it removed

## 2017-02-05 ENCOUNTER — Telehealth: Payer: Self-pay | Admitting: Neurology

## 2017-02-05 NOTE — Telephone Encounter (Signed)
Patient stopped by the office this morning to let you know that she had her Glucose monitor removed and she is ready to have the MRI rescheduled. Please Call. Thanks

## 2017-02-05 NOTE — Telephone Encounter (Signed)
Spoke with Pt, advsd her [facility] has the order still, she can call and have it scheduled

## 2017-03-16 ENCOUNTER — Telehealth: Payer: Self-pay | Admitting: Neurology

## 2017-03-16 NOTE — Telephone Encounter (Signed)
Patient wants to talk to someone about MRI needing to be order

## 2018-11-25 ENCOUNTER — Other Ambulatory Visit: Payer: Self-pay

## 2018-11-25 ENCOUNTER — Encounter: Payer: Self-pay | Admitting: Podiatry

## 2018-11-25 ENCOUNTER — Ambulatory Visit (INDEPENDENT_AMBULATORY_CARE_PROVIDER_SITE_OTHER): Payer: Medicare Other | Admitting: Podiatry

## 2018-11-25 VITALS — Temp 98.0°F

## 2018-11-25 DIAGNOSIS — M8580 Other specified disorders of bone density and structure, unspecified site: Secondary | ICD-10-CM | POA: Insufficient documentation

## 2018-11-25 DIAGNOSIS — M545 Low back pain, unspecified: Secondary | ICD-10-CM | POA: Insufficient documentation

## 2018-11-25 DIAGNOSIS — Q828 Other specified congenital malformations of skin: Secondary | ICD-10-CM | POA: Diagnosis not present

## 2018-11-25 DIAGNOSIS — E119 Type 2 diabetes mellitus without complications: Secondary | ICD-10-CM

## 2018-11-25 DIAGNOSIS — E1165 Type 2 diabetes mellitus with hyperglycemia: Secondary | ICD-10-CM | POA: Insufficient documentation

## 2018-11-25 DIAGNOSIS — E7800 Pure hypercholesterolemia, unspecified: Secondary | ICD-10-CM | POA: Insufficient documentation

## 2018-11-25 DIAGNOSIS — R2230 Localized swelling, mass and lump, unspecified upper limb: Secondary | ICD-10-CM | POA: Insufficient documentation

## 2018-11-25 DIAGNOSIS — L603 Nail dystrophy: Secondary | ICD-10-CM

## 2018-11-25 DIAGNOSIS — R222 Localized swelling, mass and lump, trunk: Secondary | ICD-10-CM | POA: Insufficient documentation

## 2018-11-25 DIAGNOSIS — E785 Hyperlipidemia, unspecified: Secondary | ICD-10-CM | POA: Insufficient documentation

## 2018-11-25 NOTE — Progress Notes (Signed)
She presents today chief concern of painfully thick discolored toenails hallux bilaterally she states that they have been like this for several months and seem to be getting worse.  Objective: Vital signs are stable she is alert and oriented x3.  Toenails are thick yellow dystrophic clinically mycotic hallux bilateral remainder of the nails appear to be relatively normal possibly dystrophic.  She also has painful hyperkeratotic lesions to the plantar aspect of the bilateral foot left greater than right  Assessment: Nail dystrophy bilateral hallux.  Porokeratosis left  Plan: Debridement of toenails 1 through 5 bilaterally but I took samples of the toenails hallux bilateral to be sent for pathologic evaluation.  Debrided reactive hyperkeratotic lesion.

## 2018-12-06 ENCOUNTER — Telehealth: Payer: Self-pay | Admitting: *Deleted

## 2018-12-06 NOTE — Telephone Encounter (Signed)
-----   Message from Garrel Ridgel, [place] sent at 12/06/2018 12:09 PM EDT ----- Negative for fungus.

## 2018-12-06 NOTE — Telephone Encounter (Signed)
Left message requesting pt call to discuss results.

## 2018-12-13 NOTE — Telephone Encounter (Signed)
Pt called for fungal culture results and I informed of Dr. Stephenie Acres review of results. Pt asked what was wrong with the toenail then. I explained that in cases of not fungus, it is often a physical damage to the toenail bed. Pt asked that I cancel the results of the appt on Thursday.

## 2018-12-16 ENCOUNTER — Ambulatory Visit: Payer: Medicare Other | Admitting: Podiatry

## 2019-02-24 ENCOUNTER — Encounter: Payer: Medicare Other | Attending: Internal Medicine | Admitting: Registered"

## 2019-02-24 ENCOUNTER — Other Ambulatory Visit: Payer: Self-pay

## 2019-02-24 ENCOUNTER — Encounter: Payer: Self-pay | Admitting: Registered"

## 2019-02-24 DIAGNOSIS — E119 Type 2 diabetes mellitus without complications: Secondary | ICD-10-CM | POA: Diagnosis not present

## 2019-02-24 NOTE — Patient Instructions (Addendum)
For weight gain using 2% or whole milk & yogurt may help. Included more sauces and olive oil, nuts and nut butters. We can keep an eye on your lab work to make sure it isn't adversely affect your lab values.  Contact your doctor about continuous glucose monitor.  Continue taking your medication Consider taking the insulin as directed Low blood sugar at 70 treat with 15 grams of carbohydrates  Goal you have set for yourself: 6:30-7 am getting up  7-8 eat breakfast  10 PB sandwich 12-1 Lunch, getting more protein (mostly poultry, fish, tofu) 2-3 snack 5-6 dinner

## 2019-02-24 NOTE — Progress Notes (Signed)
Diabetes Self-Management Education  Visit Type: First/Initial  Appt. Start Time: 0830 Appt. End Time: [id number]  02/24/2019  Amy Foley, identified by name and date of birth, is a 68 y.o. female with a diagnosis of Diabetes: Type 2.   ASSESSMENT  Weight 106 lb 9.6 oz (48.4 kg). Body mass index is 16.7 kg/m.   Patient reports that she will not take a lot of medications and was waiting for this appointment before starting insulin. Pt has had concerns about weight loss, especially since her first weight loss experience when started DM medications.  Pt states she felt good at 130 lbs and did not like getting back down to the low 100s. Current BMI 16.7 Pt states she has always been hyper and thin and used to be teased as a child for being too skinny. Pt reports that she is hyperactive.  Pt states she has appointment with Dr. Delfina Redwood tomorrow, but not sure she needs to go because she has not started insulin yet so nothing new to check. Pt states she will call office to see if they want her to come in.  Pt states for DM currently only takes Januvia and Actos. Per referral current medication she also has glimepiride prescribed but states she has not had that in a very long time.  Pt states she has been in denial about her diabetes, but has decided that she needs to make some changes now. Pt states she is a private person and hasn't told her family that she is supposed to be on insulin now.  SMBG: Pt hasn't check BG in over a week d/t dead battery in meter. Before that pt reports was checking a few times a week but knows she should be checking it more. Pt reports her FBG has been 130-160. Per referral BG was 519 mg/dL in office and an A1c 15.3%  Pt states she knows that she shouldn't go all day without eating and d/t habit of not wanting to stop in the afternoon to eat even though she might be hungry. Pt reports concerned that insulin will make her blood sugar go too low. Pt states ~2 yrs Dr.  Delfina Redwood had her wear a CGM for 2 weeks and was told that her blood sugar was doing fine at that time.  Pt states she is a creative person and also loves to make bread. Pt states she has always been one to cook her meals at home and feels she usually makes healthy choices, very seldom eats out.  Pt states she takes Vitamin D d/t deficiency.  Diabetes Self-Management Education - 02/24/19 0853      Visit Information   Visit Type  First/Initial      Initial Visit   Diabetes Type  Type 2    Are you taking your medications as prescribed?  Yes    Date Diagnosed  2000      Health Coping   How would you rate your overall health?  Fair      Psychosocial Assessment   Patient Belief/Attitude about Diabetes  Denial    How often do you need to have someone help you when you read instructions, pamphlets, or other written materials from your doctor or pharmacy?  1 - Never    What is the last grade level you completed in school?  Masters      Complications   Last HgB A1C per patient/outside source  15.3 %    How often do you check your blood sugar?  3-4 times / week   hasn't taken recently d/t dead battery in meter   Fasting Blood glucose range (mg/dL)  --   reports 120-170   Have you had a dilated eye exam in the past 12 months?  Yes    Have you had a dental exam in the past 12 months?  Yes    Are you checking your feet?  Yes    How many days per week are you checking your feet?  7      Dietary Intake   Breakfast  boiled egg, no yolk, toast, yogurt, coffee    Snack (morning)  sometimes PB    Lunch  1-2 pm left overs beans, collard greens, meat    Dinner  meat, cabbage, canned apples    Snack (evening)  1 cup yogurt plain greek with cinnamon    Beverage(s)  1 c coffee, water, 2% milk not everyday      Exercise   Exercise Type  Light (walking / raking leaves)    How many days per week to you exercise?  5    How many minutes per day do you exercise?  30    Total minutes per week of  exercise  150      Patient Education   Previous Diabetes Education  Yes (please comment)   at diagnosis 2020   Nutrition management   Role of diet in the treatment of diabetes and the relationship between the three main macronutrients and blood glucose level    Physical activity and exercise   Other (comment)   reviewed effect on hyperglycemia   Medications  Reviewed patients medication for diabetes, action, purpose, timing of dose and side effects.    Monitoring  Other (comment)   discussed CGM   Acute complications  Taught treatment of hypoglycemia - the 15 rule.      Individualized Goals (developed by patient)   Nutrition  General guidelines for healthy choices and portions discussed    Medications  take my medication as prescribed      Outcomes   Expected Outcomes  Demonstrated interest in learning. Expect positive outcomes    Future DMSE  6 months    Program Status  Not Completed       Individualized Plan for Diabetes Self-Management Training:   Learning Objective:  Patient will have a greater understanding of diabetes self-management. Patient education plan is to attend individual and/or group sessions per assessed needs and concerns.   Plan:   Patient Instructions  For weight gain using 2% or whole milk & yogurt may help. Included more sauces and olive oil, nuts and nut butters. We can keep an eye on your lab work to make sure it isn't adversely affect your lab values.  Contact your doctor about continuous glucose monitor.  Continue taking your medication Consider taking the insulin as directed Low blood sugar at 70 treat with 15 grams of carbohydrates  Goal you have set for yourself: 6:30-7 am getting up  7-8 eat breakfast  10 PB sandwich 12-1 Lunch, getting more protein (mostly poultry, fish, tofu) 2-3 snack 5-6 dinner    Expected Outcomes:  Demonstrated interest in learning. Expect positive outcomes  Education material provided: A1C conversion sheet, My  Plate and Snack sheet  If problems or questions, patient to contact team via:  Phone  Future DSME appointment: 6 months

## 2019-05-17 DIAGNOSIS — M810 Age-related osteoporosis without current pathological fracture: Secondary | ICD-10-CM | POA: Insufficient documentation

## 2019-08-31 ENCOUNTER — Ambulatory Visit: Payer: Medicare Other | Admitting: Registered"

## 2019-10-18 DIAGNOSIS — M545 Low back pain: Secondary | ICD-10-CM | POA: Diagnosis not present

## 2019-10-18 DIAGNOSIS — I10 Essential (primary) hypertension: Secondary | ICD-10-CM | POA: Diagnosis not present

## 2019-10-18 DIAGNOSIS — E7800 Pure hypercholesterolemia, unspecified: Secondary | ICD-10-CM | POA: Diagnosis not present

## 2019-10-18 DIAGNOSIS — Z1389 Encounter for screening for other disorder: Secondary | ICD-10-CM | POA: Diagnosis not present

## 2019-10-18 DIAGNOSIS — E1169 Type 2 diabetes mellitus with other specified complication: Secondary | ICD-10-CM | POA: Diagnosis not present

## 2019-10-18 DIAGNOSIS — M8588 Other specified disorders of bone density and structure, other site: Secondary | ICD-10-CM | POA: Diagnosis not present

## 2019-10-18 DIAGNOSIS — Z0000 Encounter for general adult medical examination without abnormal findings: Secondary | ICD-10-CM | POA: Diagnosis not present

## 2019-10-18 DIAGNOSIS — Z794 Long term (current) use of insulin: Secondary | ICD-10-CM | POA: Diagnosis not present

## 2020-01-31 ENCOUNTER — Other Ambulatory Visit: Payer: Self-pay

## 2020-01-31 ENCOUNTER — Encounter: Payer: Self-pay | Admitting: Podiatry

## 2020-01-31 ENCOUNTER — Ambulatory Visit (INDEPENDENT_AMBULATORY_CARE_PROVIDER_SITE_OTHER): Payer: Medicare PPO | Admitting: Podiatry

## 2020-01-31 DIAGNOSIS — Q828 Other specified congenital malformations of skin: Secondary | ICD-10-CM

## 2020-01-31 DIAGNOSIS — E119 Type 2 diabetes mellitus without complications: Secondary | ICD-10-CM

## 2020-01-31 NOTE — Progress Notes (Signed)
She presents today chief complaint of painful calluses plantar aspect of the bilateral foot 1 to the medial hallux the others to the fourth met plantarly..  Objective: Vital signs are stable alert oriented x3 pulses are palpable reactive hyper keratoma subfirst interphalangeal joint subfirst hallux and subfifth met bilaterally.  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  Plan: Chemical debridement of the poor keratomas today and I will follow-up with her on an as-needed basis.

## 2020-04-17 DIAGNOSIS — E1169 Type 2 diabetes mellitus with other specified complication: Secondary | ICD-10-CM | POA: Diagnosis not present

## 2020-04-17 DIAGNOSIS — Z794 Long term (current) use of insulin: Secondary | ICD-10-CM | POA: Diagnosis not present

## 2020-04-17 DIAGNOSIS — E7800 Pure hypercholesterolemia, unspecified: Secondary | ICD-10-CM | POA: Diagnosis not present

## 2020-04-17 DIAGNOSIS — M810 Age-related osteoporosis without current pathological fracture: Secondary | ICD-10-CM | POA: Diagnosis not present

## 2020-04-17 DIAGNOSIS — I10 Essential (primary) hypertension: Secondary | ICD-10-CM | POA: Diagnosis not present

## 2020-06-11 DIAGNOSIS — L648 Other androgenic alopecia: Secondary | ICD-10-CM | POA: Diagnosis not present

## 2020-06-11 DIAGNOSIS — D225 Melanocytic nevi of trunk: Secondary | ICD-10-CM | POA: Diagnosis not present

## 2020-06-11 DIAGNOSIS — Z1283 Encounter for screening for malignant neoplasm of skin: Secondary | ICD-10-CM | POA: Diagnosis not present

## 2020-06-18 DIAGNOSIS — M8580 Other specified disorders of bone density and structure, unspecified site: Secondary | ICD-10-CM | POA: Diagnosis not present

## 2020-07-23 DIAGNOSIS — H2513 Age-related nuclear cataract, bilateral: Secondary | ICD-10-CM | POA: Diagnosis not present

## 2020-07-23 DIAGNOSIS — E119 Type 2 diabetes mellitus without complications: Secondary | ICD-10-CM | POA: Diagnosis not present

## 2020-07-23 DIAGNOSIS — Z794 Long term (current) use of insulin: Secondary | ICD-10-CM | POA: Diagnosis not present

## 2020-07-23 DIAGNOSIS — H5213 Myopia, bilateral: Secondary | ICD-10-CM | POA: Diagnosis not present

## 2020-07-23 DIAGNOSIS — H524 Presbyopia: Secondary | ICD-10-CM | POA: Diagnosis not present

## 2020-07-23 DIAGNOSIS — E113293 Type 2 diabetes mellitus with mild nonproliferative diabetic retinopathy without macular edema, bilateral: Secondary | ICD-10-CM | POA: Diagnosis not present

## 2020-07-23 DIAGNOSIS — H40003 Preglaucoma, unspecified, bilateral: Secondary | ICD-10-CM | POA: Diagnosis not present

## 2020-07-23 DIAGNOSIS — H52203 Unspecified astigmatism, bilateral: Secondary | ICD-10-CM | POA: Diagnosis not present

## 2020-07-23 DIAGNOSIS — H35043 Retinal micro-aneurysms, unspecified, bilateral: Secondary | ICD-10-CM | POA: Diagnosis not present

## 2020-10-19 DIAGNOSIS — Z1389 Encounter for screening for other disorder: Secondary | ICD-10-CM | POA: Diagnosis not present

## 2020-10-19 DIAGNOSIS — I10 Essential (primary) hypertension: Secondary | ICD-10-CM | POA: Diagnosis not present

## 2020-10-19 DIAGNOSIS — E7800 Pure hypercholesterolemia, unspecified: Secondary | ICD-10-CM | POA: Diagnosis not present

## 2020-10-19 DIAGNOSIS — Z794 Long term (current) use of insulin: Secondary | ICD-10-CM | POA: Diagnosis not present

## 2020-10-19 DIAGNOSIS — M810 Age-related osteoporosis without current pathological fracture: Secondary | ICD-10-CM | POA: Diagnosis not present

## 2020-10-19 DIAGNOSIS — R222 Localized swelling, mass and lump, trunk: Secondary | ICD-10-CM | POA: Diagnosis not present

## 2020-10-19 DIAGNOSIS — Z0000 Encounter for general adult medical examination without abnormal findings: Secondary | ICD-10-CM | POA: Diagnosis not present

## 2020-10-19 DIAGNOSIS — E1169 Type 2 diabetes mellitus with other specified complication: Secondary | ICD-10-CM | POA: Diagnosis not present

## 2020-11-08 ENCOUNTER — Other Ambulatory Visit: Payer: Self-pay | Admitting: Internal Medicine

## 2020-11-09 ENCOUNTER — Other Ambulatory Visit: Payer: Self-pay | Admitting: Internal Medicine

## 2020-11-09 DIAGNOSIS — R222 Localized swelling, mass and lump, trunk: Secondary | ICD-10-CM

## 2020-11-09 DIAGNOSIS — R2230 Localized swelling, mass and lump, unspecified upper limb: Secondary | ICD-10-CM

## 2020-11-13 DIAGNOSIS — R21 Rash and other nonspecific skin eruption: Secondary | ICD-10-CM | POA: Diagnosis not present

## 2020-12-14 ENCOUNTER — Ambulatory Visit: Admission: RE | Admit: 2020-12-14 | Payer: Self-pay | Source: Ambulatory Visit

## 2020-12-14 ENCOUNTER — Ambulatory Visit
Admission: RE | Admit: 2020-12-14 | Discharge: 2020-12-14 | Disposition: A | Discharge status: Home / Self Care | Payer: Medicare PPO | Source: Ambulatory Visit | Attending: Internal Medicine | Admitting: Internal Medicine

## 2020-12-14 ENCOUNTER — Other Ambulatory Visit: Payer: Self-pay

## 2020-12-14 DIAGNOSIS — R222 Localized swelling, mass and lump, trunk: Secondary | ICD-10-CM

## 2020-12-14 DIAGNOSIS — N6489 Other specified disorders of breast: Secondary | ICD-10-CM | POA: Diagnosis not present

## 2020-12-14 DIAGNOSIS — R2230 Localized swelling, mass and lump, unspecified upper limb: Secondary | ICD-10-CM

## 2020-12-14 DIAGNOSIS — R922 Inconclusive mammogram: Secondary | ICD-10-CM | POA: Diagnosis not present

## 2021-01-03 DIAGNOSIS — R222 Localized swelling, mass and lump, trunk: Secondary | ICD-10-CM | POA: Diagnosis not present

## 2021-01-14 ENCOUNTER — Other Ambulatory Visit: Payer: Self-pay | Admitting: Internal Medicine

## 2021-01-16 ENCOUNTER — Other Ambulatory Visit: Payer: Self-pay | Admitting: Internal Medicine

## 2021-01-16 DIAGNOSIS — R222 Localized swelling, mass and lump, trunk: Secondary | ICD-10-CM

## 2021-01-23 DIAGNOSIS — M65341 Trigger finger, right ring finger: Secondary | ICD-10-CM | POA: Diagnosis not present

## 2021-01-23 DIAGNOSIS — M65331 Trigger finger, right middle finger: Secondary | ICD-10-CM | POA: Diagnosis not present

## 2021-01-25 DIAGNOSIS — M25641 Stiffness of right hand, not elsewhere classified: Secondary | ICD-10-CM | POA: Diagnosis not present

## 2021-01-25 DIAGNOSIS — M65331 Trigger finger, right middle finger: Secondary | ICD-10-CM | POA: Diagnosis not present

## 2021-01-25 DIAGNOSIS — M79641 Pain in right hand: Secondary | ICD-10-CM | POA: Diagnosis not present

## 2021-01-25 DIAGNOSIS — M65341 Trigger finger, right ring finger: Secondary | ICD-10-CM | POA: Diagnosis not present

## 2021-01-25 DIAGNOSIS — R531 Weakness: Secondary | ICD-10-CM | POA: Diagnosis not present

## 2021-02-01 ENCOUNTER — Other Ambulatory Visit: Payer: Medicare PPO

## 2021-02-13 DIAGNOSIS — M79641 Pain in right hand: Secondary | ICD-10-CM | POA: Diagnosis not present

## 2021-02-13 DIAGNOSIS — M65331 Trigger finger, right middle finger: Secondary | ICD-10-CM | POA: Diagnosis not present

## 2021-02-13 DIAGNOSIS — R531 Weakness: Secondary | ICD-10-CM | POA: Diagnosis not present

## 2021-02-13 DIAGNOSIS — M65341 Trigger finger, right ring finger: Secondary | ICD-10-CM | POA: Diagnosis not present

## 2021-02-13 DIAGNOSIS — M25641 Stiffness of right hand, not elsewhere classified: Secondary | ICD-10-CM | POA: Diagnosis not present

## 2021-02-25 DIAGNOSIS — M65331 Trigger finger, right middle finger: Secondary | ICD-10-CM | POA: Diagnosis not present

## 2021-02-25 DIAGNOSIS — R531 Weakness: Secondary | ICD-10-CM | POA: Diagnosis not present

## 2021-02-25 DIAGNOSIS — M79641 Pain in right hand: Secondary | ICD-10-CM | POA: Diagnosis not present

## 2021-02-25 DIAGNOSIS — M25641 Stiffness of right hand, not elsewhere classified: Secondary | ICD-10-CM | POA: Diagnosis not present

## 2021-02-25 DIAGNOSIS — M65341 Trigger finger, right ring finger: Secondary | ICD-10-CM | POA: Diagnosis not present

## 2021-03-04 DIAGNOSIS — M65331 Trigger finger, right middle finger: Secondary | ICD-10-CM | POA: Diagnosis not present

## 2021-03-04 DIAGNOSIS — M25641 Stiffness of right hand, not elsewhere classified: Secondary | ICD-10-CM | POA: Diagnosis not present

## 2021-03-04 DIAGNOSIS — R531 Weakness: Secondary | ICD-10-CM | POA: Diagnosis not present

## 2021-03-04 DIAGNOSIS — M79641 Pain in right hand: Secondary | ICD-10-CM | POA: Diagnosis not present

## 2021-03-04 DIAGNOSIS — M65341 Trigger finger, right ring finger: Secondary | ICD-10-CM | POA: Diagnosis not present

## 2021-03-13 DIAGNOSIS — M79641 Pain in right hand: Secondary | ICD-10-CM | POA: Diagnosis not present

## 2021-03-13 DIAGNOSIS — M65331 Trigger finger, right middle finger: Secondary | ICD-10-CM | POA: Diagnosis not present

## 2021-03-13 DIAGNOSIS — M65341 Trigger finger, right ring finger: Secondary | ICD-10-CM | POA: Diagnosis not present

## 2021-03-13 DIAGNOSIS — R531 Weakness: Secondary | ICD-10-CM | POA: Diagnosis not present

## 2021-03-13 DIAGNOSIS — M25641 Stiffness of right hand, not elsewhere classified: Secondary | ICD-10-CM | POA: Diagnosis not present

## 2021-03-18 ENCOUNTER — Ambulatory Visit
Admission: RE | Admit: 2021-03-18 | Discharge: 2021-03-18 | Disposition: A | Discharge status: Home / Self Care | Payer: Medicare PPO | Source: Ambulatory Visit | Attending: Internal Medicine | Admitting: Internal Medicine

## 2021-03-18 DIAGNOSIS — N6320 Unspecified lump in the left breast, unspecified quadrant: Secondary | ICD-10-CM | POA: Diagnosis not present

## 2021-03-18 DIAGNOSIS — R222 Localized swelling, mass and lump, trunk: Secondary | ICD-10-CM

## 2021-03-18 MED ORDER — GADOBUTROL 1 MMOL/ML IV SOLN
6.0000 mL | Freq: Once | INTRAVENOUS | Status: AC | PRN
  Administered 2021-03-18: 6 mL via INTRAVENOUS

## 2021-03-19 ENCOUNTER — Other Ambulatory Visit: Payer: Self-pay | Admitting: Internal Medicine

## 2021-03-19 DIAGNOSIS — R9389 Abnormal findings on diagnostic imaging of other specified body structures: Secondary | ICD-10-CM

## 2021-03-20 DIAGNOSIS — M79641 Pain in right hand: Secondary | ICD-10-CM | POA: Diagnosis not present

## 2021-03-20 DIAGNOSIS — M25641 Stiffness of right hand, not elsewhere classified: Secondary | ICD-10-CM | POA: Diagnosis not present

## 2021-03-20 DIAGNOSIS — M65341 Trigger finger, right ring finger: Secondary | ICD-10-CM | POA: Diagnosis not present

## 2021-03-20 DIAGNOSIS — M65331 Trigger finger, right middle finger: Secondary | ICD-10-CM | POA: Diagnosis not present

## 2021-03-20 DIAGNOSIS — R531 Weakness: Secondary | ICD-10-CM | POA: Diagnosis not present

## 2021-03-25 ENCOUNTER — Other Ambulatory Visit (HOSPITAL_COMMUNITY): Payer: Self-pay | Admitting: Diagnostic Radiology

## 2021-03-25 ENCOUNTER — Ambulatory Visit
Admission: RE | Admit: 2021-03-25 | Discharge: 2021-03-25 | Disposition: A | Discharge status: Home / Self Care | Payer: Medicare PPO | Source: Ambulatory Visit | Attending: Internal Medicine | Admitting: Internal Medicine

## 2021-03-25 ENCOUNTER — Other Ambulatory Visit: Payer: Self-pay

## 2021-03-25 DIAGNOSIS — N6324 Unspecified lump in the left breast, lower inner quadrant: Secondary | ICD-10-CM | POA: Diagnosis not present

## 2021-03-25 DIAGNOSIS — R9389 Abnormal findings on diagnostic imaging of other specified body structures: Secondary | ICD-10-CM

## 2021-03-25 DIAGNOSIS — N6012 Diffuse cystic mastopathy of left breast: Secondary | ICD-10-CM | POA: Diagnosis not present

## 2021-03-25 MED ORDER — GADOBUTROL 1 MMOL/ML IV SOLN
6.0000 mL | Freq: Once | INTRAVENOUS | Status: AC | PRN
  Administered 2021-03-25: 6 mL via INTRAVENOUS

## 2021-03-27 DIAGNOSIS — M65341 Trigger finger, right ring finger: Secondary | ICD-10-CM | POA: Diagnosis not present

## 2021-03-27 DIAGNOSIS — M65331 Trigger finger, right middle finger: Secondary | ICD-10-CM | POA: Diagnosis not present

## 2021-03-27 DIAGNOSIS — E119 Type 2 diabetes mellitus without complications: Secondary | ICD-10-CM | POA: Diagnosis not present

## 2021-04-12 ENCOUNTER — Ambulatory Visit: Payer: Self-pay | Admitting: Surgery

## 2021-04-12 DIAGNOSIS — D1722 Benign lipomatous neoplasm of skin and subcutaneous tissue of left arm: Secondary | ICD-10-CM | POA: Diagnosis not present

## 2021-04-12 NOTE — H&P (Signed)
Subjective   Chief Complaint: Left axillary mass     History of Present Illness: Amy Foley is a 70 y.o. female who is seen today as an office consultation at the request of Dr. Delfina Redwood for evaluation of Left axillary mass .    This is a 70 year old female who presents with a 10-year history of a slowly enlarging mass in the left axilla.  This is fairly superficial.  It has increased slightly in size over the years.  The patient does not have any pain in her axilla or radiating down her arm but she does have discomfort in her left shoulder with limited range of motion.  Recently she underwent further imaging including a breast MRI.  This showed no significant findings in the axilla but she did have a left breast mass.  This was biopsied and was benign.  She presents now to discuss excision of the axillary mass.   Review of Systems: A complete review of systems was obtained from the patient.  I have reviewed this information and discussed as appropriate with the patient.  See HPI as well for other ROS.  Review of Systems  Constitutional: Negative.   HENT: Negative.   Eyes: Negative.   Respiratory: Negative.   Cardiovascular: Negative.   Gastrointestinal: Negative.   Genitourinary: Negative.   Musculoskeletal: Positive for joint pain.  Skin: Negative.   Neurological: Negative.   Endo/Heme/Allergies: Negative.   Psychiatric/Behavioral: Negative.       Medical History: Past Medical History:  Diagnosis Date   Diabetes mellitus without complication (CMS-HCC)    DVT (deep venous thrombosis) (CMS-HCC)     Patient Active Problem List  Diagnosis   Uncontrolled type 2 diabetes mellitus with hyperglycemia (CMS-HCC)   Axillary fullness   Essential hypertension   Hyperlipidemia   Osteoporosis   Osteopenia    Past Surgical History:  Procedure Laterality Date   foot surgery       Allergies  Allergen Reactions   Codeine Other (See Comments)    Passed out Other  reaction(s): Other (See Comments) Passed out    Hydrocodone Other (See Comments)   Hydrocodone-Chlorpheniramine Other (See Comments)   Metformin Other (See Comments)    Dizziness Other reaction(s): dizzy Other reaction(s): dizzy     Current Outpatient Medications on File Prior to Visit  Medication Sig Dispense Refill   insulin GLARGINE (LANTUS SOLOSTAR U-100 INSULIN) pen injector (concentration 100 units/mL) Lantus Solostar U-100 Insulin 100 unit/mL (3 mL) subcutaneous pen     metoprolol succinate (TOPROL XL) 25 MG XL tablet Toprol XL 25 mg tablet,extended release     pioglitazone (ACTOS) 45 MG tablet pioglitazone 45 mg tablet     ramipriL (ALTACE) 5 MG capsule ramipril 5 mg capsule     SITagliptin phosphate (JANUVIA) 100 MG tablet Januvia 100 mg tablet     No current facility-administered medications on file prior to visit.    Family History  Problem Relation Age of Onset   High blood pressure (Hypertension) Father    Hyperlipidemia (Elevated cholesterol) Father    Diabetes Father    High blood pressure (Hypertension) Brother    Hyperlipidemia (Elevated cholesterol) Brother    Diabetes Brother      Social History   Tobacco Use  Smoking Status Never  Smokeless Tobacco Never     Social History   Socioeconomic History   Marital status: Single  Tobacco Use   Smoking status: Never   Smokeless tobacco: Never  Substance and Sexual Activity   Alcohol  use: Yes   Drug use: Never    Objective:    Vitals:   04/12/21 0840  BP: 128/70  Pulse: 80  Temp: 36.2 C (97.2 F)  SpO2: 98%  Weight: 60.4 kg (133 lb 3.2 oz)  Height: 170.2 cm (5\' 7" )    Body mass index is 20.86 kg/m.  Physical Exam   Constitutional:  WDWN in NAD, conversant, no obvious deformities; lying in bed comfortably Eyes:  Pupils equal, round; sclera anicteric; moist conjunctiva; no lid lag HENT:  Oral mucosa moist; good dentition  Neck:  No masses palpated, trachea midline; no  thyromegaly Lungs:  CTA bilaterally; normal respiratory effort Left axilla shows a protruding lobular superficial mass.  This measures over 6 cm across.  There are no overlying skin changes.  No palpable lymphadenopathy. CV:  Regular rate and rhythm; no murmurs; extremities well-perfused with no edema Abd:  +bowel sounds, soft, non-tender, no palpable organomegaly; no palpable hernias Musc:  Unable to assess gait; no apparent clubbing or cyanosis in extremities Lymphatic:  No palpable cervical or axillary lymphadenopathy Skin:  Warm, dry; no sign of jaundice Psychiatric - alert and oriented x 4; calm mood and affect   Labs, Imaging and Diagnostic Testing: CLINICAL DATA:  Patient reports a left axilla lump. She says that she was evaluated for this several years ago at the breast center, and that this mass has been present for years. Does report some discomfort from the mass and a change in its character. Is unclear whether it has shown in a significant increase in size. The prior diagnostic mammogram and ultrasound for this lesion was performed on 07/17/2015.   EXAM: DIGITAL DIAGNOSTIC BILATERAL MAMMOGRAM WITH TOMOSYNTHESIS AND CAD; [place] AXILLARY LEFT   TECHNIQUE: Bilateral digital diagnostic mammography and breast tomosynthesis was performed. The images were evaluated with computer-aided detection.; Targeted ultrasound examination of the left axilla was performed.   COMPARISON:  Previous exam(s).   ACR Breast Density Category c: The breast tissue is heterogeneously dense, which may obscure small masses.   FINDINGS: There are no breast masses, areas of architectural distortion, areas of significant asymmetry or suspicious calcifications.   In the left axilla there is a partly imaged fat density mass that is consistent with a lipoma and corresponds to the palpable abnormality.   On physical exam, there is a soft mass just below the skin that bulges the overlying skin contour,  centered on the mid axilla.   Targeted ultrasound is performed, showing a large mass in the left axilla corresponding to the palpable abnormality. This is mildly hyperechoic compared to normal subcutaneous fat, but has a pattern of echogenicity consistent with fat. It spans approximately 6.6 x 3.4 x 6.2 cm. This measures larger than on the ultrasound dated 07/17/2015 where it measured 5.3 x 1.5 x 5.2 cm, but shows the same imaging characteristics. There is internal blood flow within the mass on color Doppler analysis.   IMPRESSION: 1. No evidence of breast malignancy. 2. Superficial left axillary mass is consistent with a lipoma. It has shown a mild increase in size when compared to the ultrasound dated 07/17/2015, although this difference could be due to differences in measurement technique. Patient reports a change in the character of the mass and some reported discomfort. For these reasons, the possibility of a well differentiated liposarcoma should be considered, which would be best evaluated with MRI without and with contrast.   RECOMMENDATION: 1.  Screening mammogram in one year.(Code:SM-B-01Y) 2. Consider MRI without and with contrast  for the left axillary mass to exclude a well differentiated liposarcoma.   I have discussed the findings and recommendations with the patient. If applicable, a reminder letter will be sent to the patient regarding the next appointment.   BI-RADS CATEGORY  2: Benign.     Electronically Signed   By: Lajean Manes M.D.   On: 12/14/2020 15:36  CLINICAL DATA:  70 year old female with recent diagnostic study performed on 12/14/2020 which showed a superficial left axillary mass consistent with a lipoma. Further evaluation with MRI recommended to exclude a liposarcoma.   LABS:  None obtained at the time of imaging.   EXAM: BILATERAL BREAST MRI WITH AND WITHOUT CONTRAST   TECHNIQUE: Multiplanar, multisequence MR images of both breasts were  obtained prior to and following the intravenous administration of 6 ml of Gadavist   Three-dimensional MR images were rendered by post-processing of the original MR data on an independent workstation. The three-dimensional MR images were interpreted, and findings are reported in the following complete MRI report for this study. Three dimensional images were evaluated at the independent interpreting workstation using the DynaCAD thin client.   COMPARISON:  Previous exam(s).   FINDINGS: Breast composition: c. Heterogeneous fibroglandular tissue.   Background parenchymal enhancement: Moderate.   Right breast: No mass or abnormal enhancement.   Left breast: In the lower-inner quadrant of the left breast is an enhancing mass 8 x 10 x 9 mm (series 7, image 154). No enhancing mass is seen in the left axilla.   Lymph nodes: No abnormal appearing lymph nodes.   Ancillary findings:  None.   IMPRESSION: Indeterminate enhancing mass in the lower-inner quadrant of the left breast.   RECOMMENDATION: MR guided core biopsy of the enhancing mass in the lower-inner quadrant of the left breast is recommended.   BI-RADS CATEGORY  4: Suspicious.     Electronically Signed   By: Lillia Mountain M.D.   On: 03/18/2021 12:40  Assessment and Plan:  Diagnoses and all orders for this visit:  Lipoma of left axilla   6.6 cm on [place]  Excision of left axillary lipoma.The surgical procedure has been discussed with the patient.  Potential risks, benefits, alternative treatments, and expected outcomes have been explained.  All of the patient's questions at this time have been answered.  The likelihood of reaching the patient's treatment goal is good.  The patient understand the proposed surgical procedure and wishes to proceed.   Shemia Bevel Jearld Adjutant, MD  04/12/2021 8:51 AM

## 2021-04-19 DIAGNOSIS — I10 Essential (primary) hypertension: Secondary | ICD-10-CM | POA: Diagnosis not present

## 2021-04-19 DIAGNOSIS — E113293 Type 2 diabetes mellitus with mild nonproliferative diabetic retinopathy without macular edema, bilateral: Secondary | ICD-10-CM | POA: Diagnosis not present

## 2021-04-19 DIAGNOSIS — M810 Age-related osteoporosis without current pathological fracture: Secondary | ICD-10-CM | POA: Diagnosis not present

## 2021-04-19 DIAGNOSIS — E7800 Pure hypercholesterolemia, unspecified: Secondary | ICD-10-CM | POA: Diagnosis not present

## 2021-04-19 DIAGNOSIS — D1722 Benign lipomatous neoplasm of skin and subcutaneous tissue of left arm: Secondary | ICD-10-CM | POA: Diagnosis not present

## 2021-04-19 DIAGNOSIS — Z7984 Long term (current) use of oral hypoglycemic drugs: Secondary | ICD-10-CM | POA: Diagnosis not present

## 2021-04-19 DIAGNOSIS — Z794 Long term (current) use of insulin: Secondary | ICD-10-CM | POA: Diagnosis not present

## 2021-05-14 DIAGNOSIS — D171 Benign lipomatous neoplasm of skin and subcutaneous tissue of trunk: Secondary | ICD-10-CM | POA: Diagnosis not present

## 2021-05-14 DIAGNOSIS — D1739 Benign lipomatous neoplasm of skin and subcutaneous tissue of other sites: Secondary | ICD-10-CM | POA: Diagnosis not present

## 2021-07-15 DIAGNOSIS — Z01419 Encounter for gynecological examination (general) (routine) without abnormal findings: Secondary | ICD-10-CM | POA: Diagnosis not present

## 2021-07-15 DIAGNOSIS — Z1272 Encounter for screening for malignant neoplasm of vagina: Secondary | ICD-10-CM | POA: Diagnosis not present

## 2021-07-29 DIAGNOSIS — Z1211 Encounter for screening for malignant neoplasm of colon: Secondary | ICD-10-CM | POA: Diagnosis not present

## 2021-07-29 DIAGNOSIS — K644 Residual hemorrhoidal skin tags: Secondary | ICD-10-CM | POA: Diagnosis not present

## 2021-08-14 DIAGNOSIS — M65341 Trigger finger, right ring finger: Secondary | ICD-10-CM | POA: Diagnosis not present

## 2021-08-14 DIAGNOSIS — M65331 Trigger finger, right middle finger: Secondary | ICD-10-CM | POA: Diagnosis not present

## 2021-08-30 DIAGNOSIS — M5416 Radiculopathy, lumbar region: Secondary | ICD-10-CM | POA: Diagnosis not present

## 2021-09-06 ENCOUNTER — Other Ambulatory Visit: Payer: Self-pay | Admitting: Neurological Surgery

## 2021-09-06 DIAGNOSIS — M5416 Radiculopathy, lumbar region: Secondary | ICD-10-CM

## 2021-09-09 DIAGNOSIS — H2513 Age-related nuclear cataract, bilateral: Secondary | ICD-10-CM | POA: Diagnosis not present

## 2021-09-09 DIAGNOSIS — H40003 Preglaucoma, unspecified, bilateral: Secondary | ICD-10-CM | POA: Diagnosis not present

## 2021-09-09 DIAGNOSIS — H25013 Cortical age-related cataract, bilateral: Secondary | ICD-10-CM | POA: Diagnosis not present

## 2021-09-09 DIAGNOSIS — E119 Type 2 diabetes mellitus without complications: Secondary | ICD-10-CM | POA: Diagnosis not present

## 2021-09-09 DIAGNOSIS — H5213 Myopia, bilateral: Secondary | ICD-10-CM | POA: Diagnosis not present

## 2021-09-09 DIAGNOSIS — E113293 Type 2 diabetes mellitus with mild nonproliferative diabetic retinopathy without macular edema, bilateral: Secondary | ICD-10-CM | POA: Diagnosis not present

## 2021-09-09 DIAGNOSIS — Z794 Long term (current) use of insulin: Secondary | ICD-10-CM | POA: Diagnosis not present

## 2021-09-09 DIAGNOSIS — H524 Presbyopia: Secondary | ICD-10-CM | POA: Diagnosis not present

## 2021-09-09 DIAGNOSIS — H52203 Unspecified astigmatism, bilateral: Secondary | ICD-10-CM | POA: Diagnosis not present

## 2021-09-10 ENCOUNTER — Other Ambulatory Visit: Payer: Self-pay | Admitting: Obstetrics and Gynecology

## 2021-09-10 DIAGNOSIS — D242 Benign neoplasm of left breast: Secondary | ICD-10-CM

## 2021-09-27 ENCOUNTER — Ambulatory Visit
Admission: RE | Admit: 2021-09-27 | Discharge: 2021-09-27 | Disposition: A | Discharge status: Home / Self Care | Payer: Medicare PPO | Source: Ambulatory Visit | Attending: Neurological Surgery | Admitting: Neurological Surgery

## 2021-09-27 ENCOUNTER — Ambulatory Visit
Admission: RE | Admit: 2021-09-27 | Discharge: 2021-09-27 | Disposition: A | Discharge status: Home / Self Care | Payer: Medicare PPO | Source: Ambulatory Visit | Attending: Obstetrics and Gynecology | Admitting: Obstetrics and Gynecology

## 2021-09-27 DIAGNOSIS — I10 Essential (primary) hypertension: Secondary | ICD-10-CM | POA: Diagnosis not present

## 2021-09-27 DIAGNOSIS — M545 Low back pain, unspecified: Secondary | ICD-10-CM | POA: Diagnosis not present

## 2021-09-27 DIAGNOSIS — N6489 Other specified disorders of breast: Secondary | ICD-10-CM | POA: Diagnosis not present

## 2021-09-27 DIAGNOSIS — R200 Anesthesia of skin: Secondary | ICD-10-CM | POA: Diagnosis not present

## 2021-09-27 DIAGNOSIS — M48061 Spinal stenosis, lumbar region without neurogenic claudication: Secondary | ICD-10-CM | POA: Diagnosis not present

## 2021-09-27 DIAGNOSIS — D242 Benign neoplasm of left breast: Secondary | ICD-10-CM

## 2021-09-27 DIAGNOSIS — M5416 Radiculopathy, lumbar region: Secondary | ICD-10-CM

## 2021-09-27 DIAGNOSIS — E119 Type 2 diabetes mellitus without complications: Secondary | ICD-10-CM | POA: Diagnosis not present

## 2021-09-27 MED ORDER — GADOBUTROL 1 MMOL/ML IV SOLN
6.0000 mL | Freq: Once | INTRAVENOUS | Status: AC | PRN
  Administered 2021-09-27: 6 mL via INTRAVENOUS

## 2021-10-03 DIAGNOSIS — M65341 Trigger finger, right ring finger: Secondary | ICD-10-CM | POA: Diagnosis not present

## 2021-10-03 DIAGNOSIS — M65331 Trigger finger, right middle finger: Secondary | ICD-10-CM | POA: Diagnosis not present

## 2021-10-08 ENCOUNTER — Ambulatory Visit: Payer: Medicare PPO | Admitting: Podiatry

## 2021-10-10 DIAGNOSIS — Z4800 Encounter for change or removal of nonsurgical wound dressing: Secondary | ICD-10-CM | POA: Diagnosis not present

## 2021-10-15 DIAGNOSIS — Z9889 Other specified postprocedural states: Secondary | ICD-10-CM | POA: Diagnosis not present

## 2021-10-15 DIAGNOSIS — Z4789 Encounter for other orthopedic aftercare: Secondary | ICD-10-CM | POA: Diagnosis not present

## 2021-10-30 DIAGNOSIS — M5416 Radiculopathy, lumbar region: Secondary | ICD-10-CM | POA: Diagnosis not present

## 2021-11-07 ENCOUNTER — Encounter: Payer: Self-pay | Admitting: Podiatry

## 2021-11-07 ENCOUNTER — Ambulatory Visit: Payer: Medicare PPO | Admitting: Podiatry

## 2021-11-07 DIAGNOSIS — D2372 Other benign neoplasm of skin of left lower limb, including hip: Secondary | ICD-10-CM | POA: Diagnosis not present

## 2021-11-07 DIAGNOSIS — D2371 Other benign neoplasm of skin of right lower limb, including hip: Secondary | ICD-10-CM | POA: Diagnosis not present

## 2021-11-07 NOTE — Progress Notes (Signed)
She presents today concerned about the calluses to the hallux interphalangeal joints bilaterally.  Objective: Vital signs are stable she alert and oriented x3 mild reactive hyper keratomas secondary to abnormality of the joint on the right foot limiting the dorsiflexion as well as hallux valgus deformity on the left foot.  Assessment: Reactive hyper keratoma benign skin lesions interphalangeal joints bilateral foot.  Plan: Debridement of benign skin lesions today placed on silicone padding follow-up with her as needed.

## 2021-11-08 DIAGNOSIS — Z9889 Other specified postprocedural states: Secondary | ICD-10-CM | POA: Diagnosis not present

## 2021-11-08 DIAGNOSIS — M65331 Trigger finger, right middle finger: Secondary | ICD-10-CM | POA: Diagnosis not present

## 2021-11-13 DIAGNOSIS — E113293 Type 2 diabetes mellitus with mild nonproliferative diabetic retinopathy without macular edema, bilateral: Secondary | ICD-10-CM | POA: Diagnosis not present

## 2021-11-13 DIAGNOSIS — M810 Age-related osteoporosis without current pathological fracture: Secondary | ICD-10-CM | POA: Diagnosis not present

## 2021-11-13 DIAGNOSIS — Z794 Long term (current) use of insulin: Secondary | ICD-10-CM | POA: Diagnosis not present

## 2021-11-13 DIAGNOSIS — Z79899 Other long term (current) drug therapy: Secondary | ICD-10-CM | POA: Diagnosis not present

## 2021-11-13 DIAGNOSIS — Z0000 Encounter for general adult medical examination without abnormal findings: Secondary | ICD-10-CM | POA: Diagnosis not present

## 2021-11-13 DIAGNOSIS — I10 Essential (primary) hypertension: Secondary | ICD-10-CM | POA: Diagnosis not present

## 2021-11-13 DIAGNOSIS — E7800 Pure hypercholesterolemia, unspecified: Secondary | ICD-10-CM | POA: Diagnosis not present

## 2021-11-13 DIAGNOSIS — Z5181 Encounter for therapeutic drug level monitoring: Secondary | ICD-10-CM | POA: Diagnosis not present

## 2022-01-16 DIAGNOSIS — Z1231 Encounter for screening mammogram for malignant neoplasm of breast: Secondary | ICD-10-CM | POA: Diagnosis not present

## 2022-03-20 DIAGNOSIS — J40 Bronchitis, not specified as acute or chronic: Secondary | ICD-10-CM | POA: Diagnosis not present

## 2022-03-20 DIAGNOSIS — J329 Chronic sinusitis, unspecified: Secondary | ICD-10-CM | POA: Diagnosis not present

## 2022-05-16 DIAGNOSIS — E7800 Pure hypercholesterolemia, unspecified: Secondary | ICD-10-CM | POA: Diagnosis not present

## 2022-05-16 DIAGNOSIS — M810 Age-related osteoporosis without current pathological fracture: Secondary | ICD-10-CM | POA: Diagnosis not present

## 2022-05-16 DIAGNOSIS — E113293 Type 2 diabetes mellitus with mild nonproliferative diabetic retinopathy without macular edema, bilateral: Secondary | ICD-10-CM | POA: Diagnosis not present

## 2022-05-16 DIAGNOSIS — I10 Essential (primary) hypertension: Secondary | ICD-10-CM | POA: Diagnosis not present

## 2022-07-15 ENCOUNTER — Ambulatory Visit (INDEPENDENT_AMBULATORY_CARE_PROVIDER_SITE_OTHER): Payer: Medicare PPO | Admitting: Podiatry

## 2022-07-15 ENCOUNTER — Encounter: Payer: Self-pay | Admitting: Podiatry

## 2022-07-15 VITALS — BP 112/65 | HR 70

## 2022-07-15 DIAGNOSIS — L603 Nail dystrophy: Secondary | ICD-10-CM

## 2022-07-15 DIAGNOSIS — D2371 Other benign neoplasm of skin of right lower limb, including hip: Secondary | ICD-10-CM | POA: Diagnosis not present

## 2022-07-15 DIAGNOSIS — D2372 Other benign neoplasm of skin of left lower limb, including hip: Secondary | ICD-10-CM

## 2022-07-15 NOTE — Progress Notes (Signed)
She presents today concerned about the calluses to the plantar aspect of her left foot and the medial aspect of the hallux interphalangeal joint.  She is also concerned about a discoloration on the fibular border of the hallux nail right.  Objective: Vital signs are stable alert and oriented x 3.  Pulses are palpable.  There is no erythema edema cellulitis drainage or odor.  She has reactive hyper keratomas medial aspect of the hallux bilaterally and the plantar aspect of the fourth metatarsal head left foot.  No open lesions or wounds are noted.  Discoloration fibular border of the hallux right appears to be nail dystrophy rather than fungus.  Assessment: Onychodystrophy pain in limb secondary to benign skin lesion.  Plan: Debridement of benign skin lesions bilaterally and debridement of toenail.  May need to consider sampling this next visit if not resolved

## 2022-08-27 DIAGNOSIS — M810 Age-related osteoporosis without current pathological fracture: Secondary | ICD-10-CM | POA: Diagnosis not present

## 2022-08-27 DIAGNOSIS — Z01419 Encounter for gynecological examination (general) (routine) without abnormal findings: Secondary | ICD-10-CM | POA: Diagnosis not present

## 2022-10-02 DIAGNOSIS — H40003 Preglaucoma, unspecified, bilateral: Secondary | ICD-10-CM | POA: Diagnosis not present

## 2022-10-02 DIAGNOSIS — H5213 Myopia, bilateral: Secondary | ICD-10-CM | POA: Diagnosis not present

## 2022-10-02 DIAGNOSIS — Z794 Long term (current) use of insulin: Secondary | ICD-10-CM | POA: Diagnosis not present

## 2022-10-02 DIAGNOSIS — E119 Type 2 diabetes mellitus without complications: Secondary | ICD-10-CM | POA: Diagnosis not present

## 2022-10-02 DIAGNOSIS — H2513 Age-related nuclear cataract, bilateral: Secondary | ICD-10-CM | POA: Diagnosis not present

## 2022-10-02 DIAGNOSIS — H43393 Other vitreous opacities, bilateral: Secondary | ICD-10-CM | POA: Diagnosis not present

## 2022-10-02 DIAGNOSIS — E113293 Type 2 diabetes mellitus with mild nonproliferative diabetic retinopathy without macular edema, bilateral: Secondary | ICD-10-CM | POA: Diagnosis not present

## 2022-10-02 DIAGNOSIS — H47393 Other disorders of optic disc, bilateral: Secondary | ICD-10-CM | POA: Diagnosis not present

## 2022-10-02 DIAGNOSIS — H25013 Cortical age-related cataract, bilateral: Secondary | ICD-10-CM | POA: Diagnosis not present

## 2022-10-19 IMAGING — MR MR BREAST BILAT WO/W CM
8 of 12 series · 33 of 48 positions shown · IV contrast (6 mL Gadavist)
Comparison: Previous mammograms and breast ultrasounds. Previous
MRI 03/18/2021.

CLINICAL DATA: Screening breast MRI and follow-up 2 benign left
breast MRI biopsy March 2021 as previous benign MRI guided left
breast biopsy demonstrating fibroadenomatoid nodule with adenosis
and fibrocystic change over the lower central breast. Lipoma removed
from the left axilla 8688.

EXAM:
BILATERAL BREAST MRI WITH AND WITHOUT CONTRAST
TECHNIQUE: Multiplanar, multisequence MR images of both breasts were obtained
prior to and following the intravenous administration of 6 ml of
Gadavist.

[Series 2: t2_tirm_tra ipat (a-p) · axial · 3.0mm · 0.70mm/px · 1 of 55 slices shown]
[im 1/55]
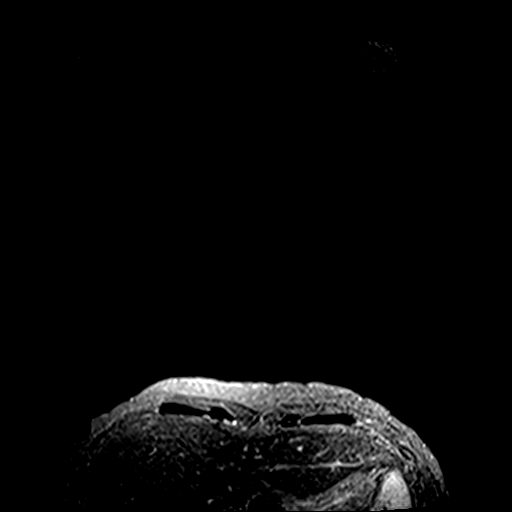

[Series 3: fl3d pre-cm no · axial · non-contrast · 1.2mm · 0.94mm/px · z∈[-86,+85]mm · 5 of 144 slices shown]
[im 1/144]
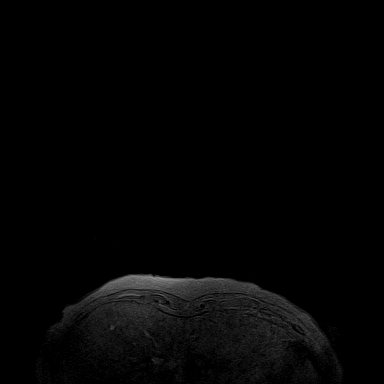
[im 36/144]
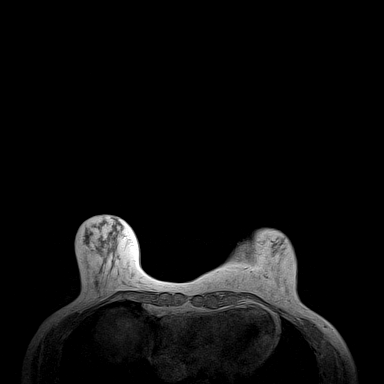
[im 72/144]
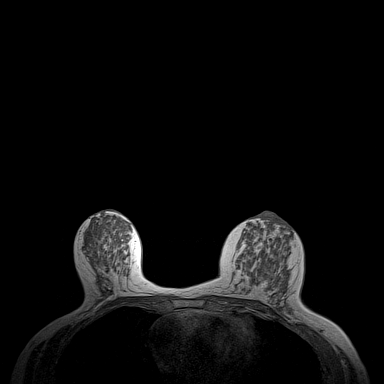
[im 108/144]
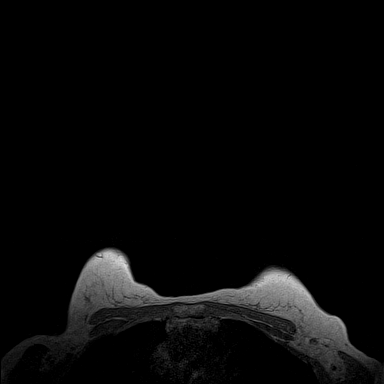
[im 144/144]
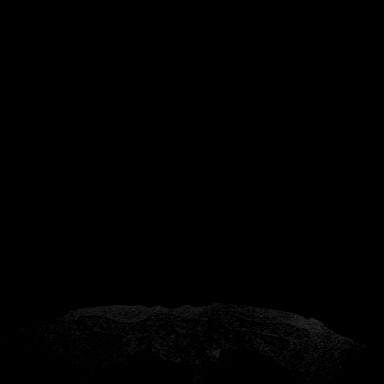

[Series 4: fl3d pre-cm · axial · non-contrast · 1.2mm · 0.94mm/px · z∈[-86,+85]mm · 5 of 144 slices shown]
[im 1/144]
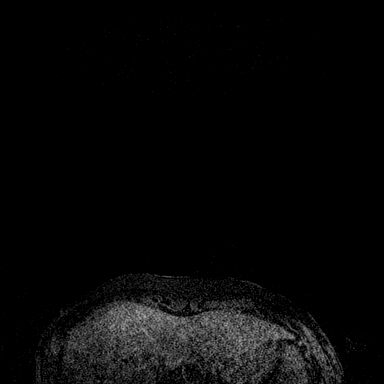
[im 36/144]
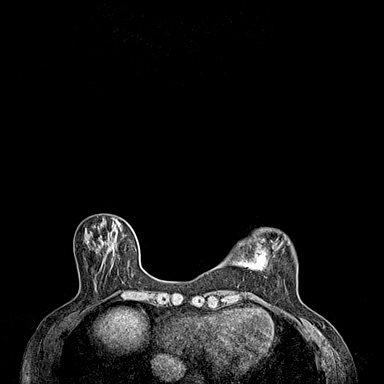
[im 72/144]
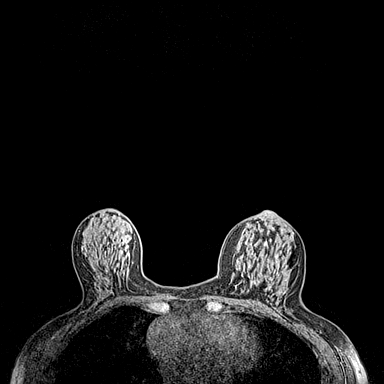
[im 108/144]
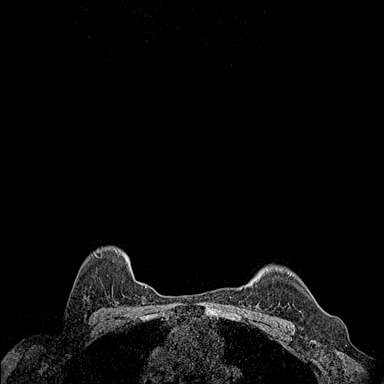
[im 144/144]
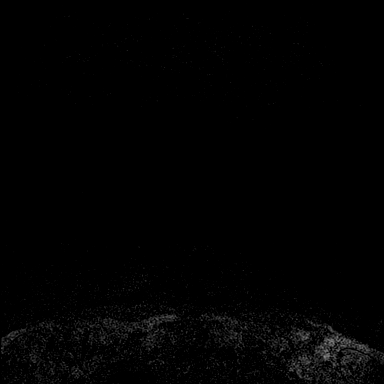

[Series 5: fl3d post-cm 20 · axial · 1.2mm · 0.94mm/px · z∈[-86,+85]mm · 5 of 144 slices shown (1 of 3)]
[im 1/144]
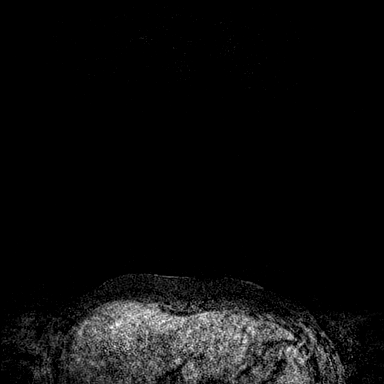
[im 36/144]
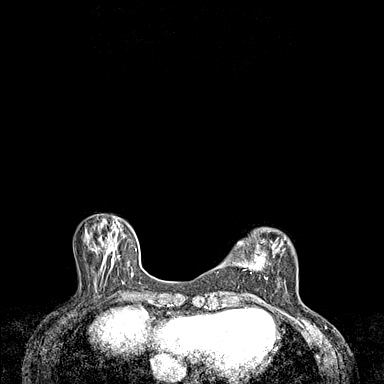
[im 72/144]
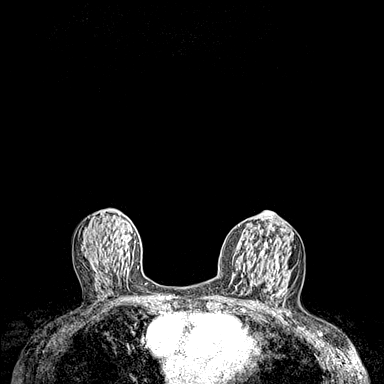
[im 108/144]
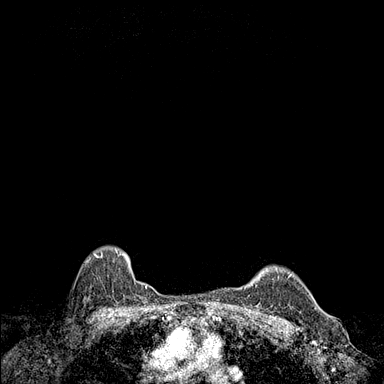
[im 144/144]
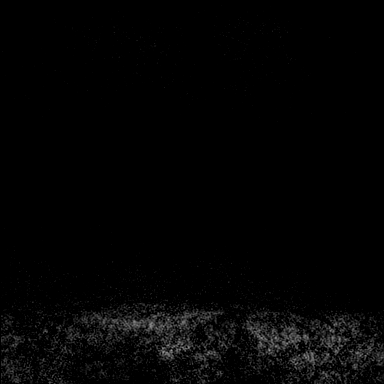

[Series 6: fl3d post-cm 20 · axial · 1.2mm · 0.94mm/px · z∈[-86,+85]mm · 5 of 144 slices shown (2 of 3)]
[im 1/144]
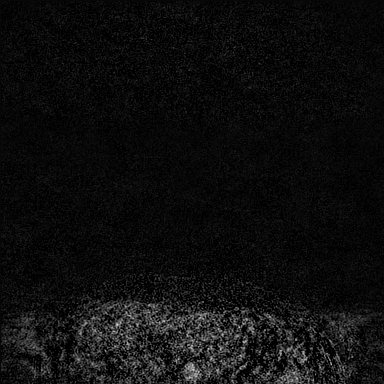
[im 36/144]
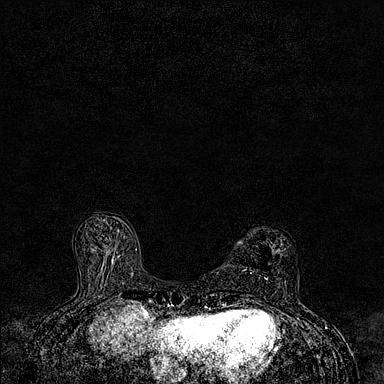
[im 72/144]
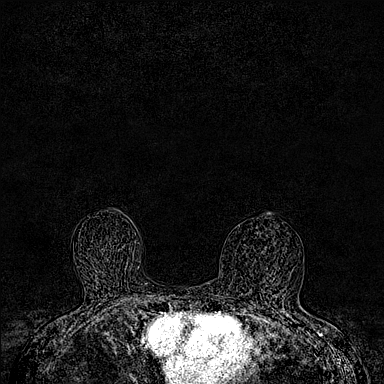
[im 108/144]
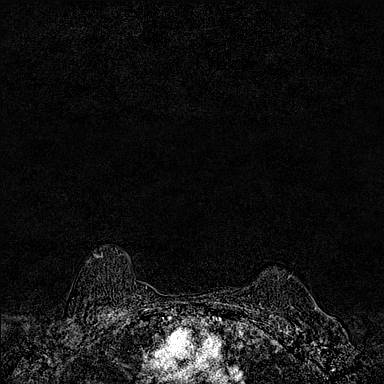
[im 144/144]
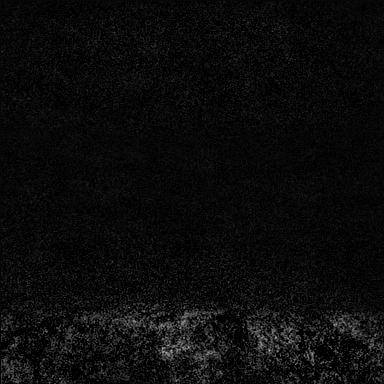

[Series 7: fl3d post-cm 20 · axial · 172.8mm · 0.94mm/px · 1 of 1 slices shown (3 of 3)]
[im 1/1]
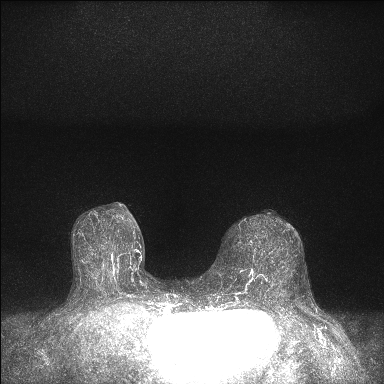

[Series 8: fl3d post-cm 3 · axial · 1.2mm · 0.94mm/px · z∈[-86,+85]mm · 6 of 144 slices shown (1 of 2)]
[im 1/144]
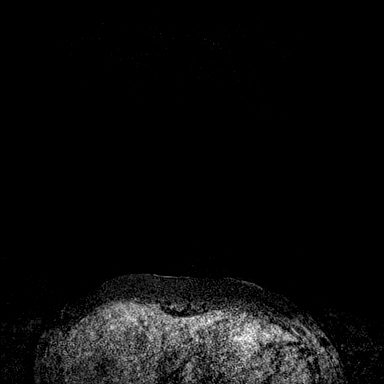
[im 29/144]
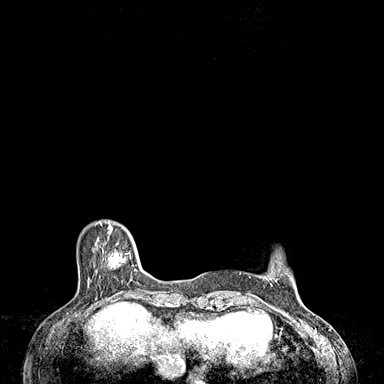
[im 58/144]
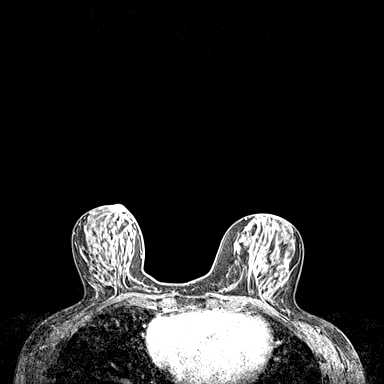
[im 86/144]
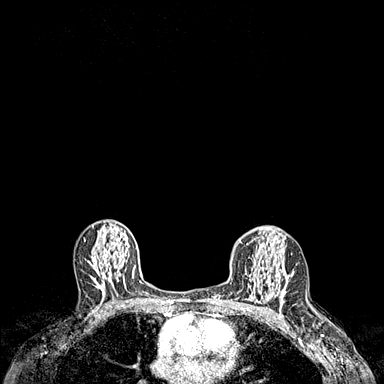
[im 115/144]
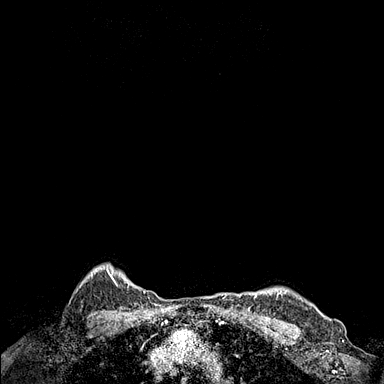
[im 144/144]
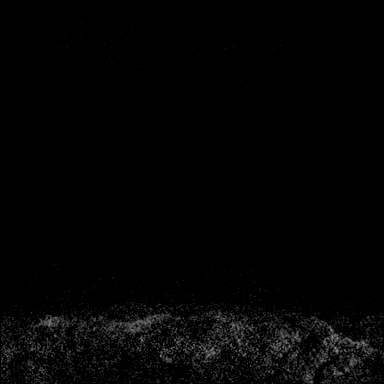

[Series 9: fl3d post-cm 3 · axial · 1.2mm · 0.94mm/px · z∈[-86,+51]mm · 5 of 144 slices shown (2 of 2)]
[im 1/144]
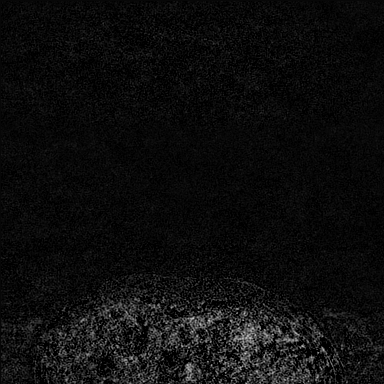
[im 29/144]
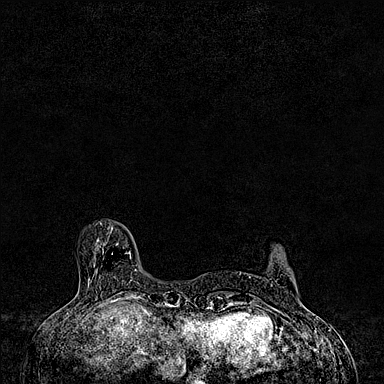
[im 58/144]
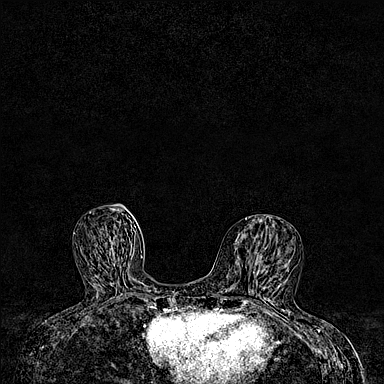
[im 86/144]
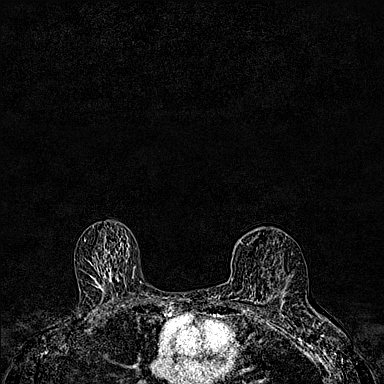
[im 115/144]
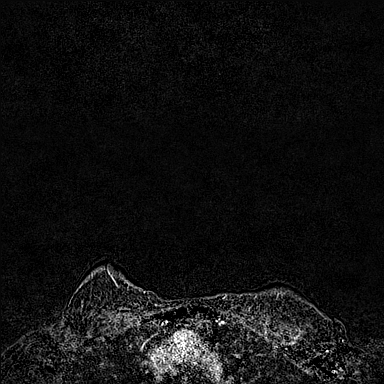

[33 of 48 positions shown; findings below may reference images not displayed]

Three-dimensional MR images were rendered by post-processing of the
original MR data on an independent workstation. The
three-dimensional MR images were interpreted, and findings are
reported in the following complete MRI report for this study. Three
dimensional images were evaluated at the independent interpreting
workstation using the DynaCAD thin client.
FINDINGS: Breast composition: c. Heterogeneous fibroglandular tissue.

Background parenchymal enhancement: Moderate.

Right breast: No mass or abnormal enhancement.

Left breast: Post biopsy change over the lower central left breast
with clip artifact present. No suspicious enhancement or focal mass.

Lymph nodes: No abnormal appearing lymph nodes.

Ancillary findings:  None.
IMPRESSION: Post biopsy change over the lower central left breast, otherwise
unremarkable breast MRI without evidence of malignancy within either
breast.

RECOMMENDATION:
Recommend continued annual screening 3D mammography.

The [employer] recommends annual MRI and mammography in
patients with an estimated lifetime risk of developing breast cancer
greater than 20 - 25%, or who are known or suspected to be positive
for the breast cancer gene.

BI-RADS CATEGORY  1: Negative.

## 2022-11-26 DIAGNOSIS — E349 Endocrine disorder, unspecified: Secondary | ICD-10-CM | POA: Diagnosis not present

## 2022-11-26 DIAGNOSIS — M8588 Other specified disorders of bone density and structure, other site: Secondary | ICD-10-CM | POA: Diagnosis not present

## 2022-11-26 DIAGNOSIS — Z1231 Encounter for screening mammogram for malignant neoplasm of breast: Secondary | ICD-10-CM | POA: Diagnosis not present

## 2022-11-26 DIAGNOSIS — N958 Other specified menopausal and perimenopausal disorders: Secondary | ICD-10-CM | POA: Diagnosis not present

## 2022-12-31 DIAGNOSIS — Z79899 Other long term (current) drug therapy: Secondary | ICD-10-CM | POA: Diagnosis not present

## 2022-12-31 DIAGNOSIS — Z794 Long term (current) use of insulin: Secondary | ICD-10-CM | POA: Diagnosis not present

## 2022-12-31 DIAGNOSIS — Z0000 Encounter for general adult medical examination without abnormal findings: Secondary | ICD-10-CM | POA: Diagnosis not present

## 2022-12-31 DIAGNOSIS — E7800 Pure hypercholesterolemia, unspecified: Secondary | ICD-10-CM | POA: Diagnosis not present

## 2022-12-31 DIAGNOSIS — M810 Age-related osteoporosis without current pathological fracture: Secondary | ICD-10-CM | POA: Diagnosis not present

## 2022-12-31 DIAGNOSIS — E113293 Type 2 diabetes mellitus with mild nonproliferative diabetic retinopathy without macular edema, bilateral: Secondary | ICD-10-CM | POA: Diagnosis not present

## 2022-12-31 DIAGNOSIS — Z5181 Encounter for therapeutic drug level monitoring: Secondary | ICD-10-CM | POA: Diagnosis not present

## 2022-12-31 DIAGNOSIS — I10 Essential (primary) hypertension: Secondary | ICD-10-CM | POA: Diagnosis not present

## 2023-03-26 DIAGNOSIS — M79644 Pain in right finger(s): Secondary | ICD-10-CM | POA: Diagnosis not present

## 2023-03-26 DIAGNOSIS — M1811 Unilateral primary osteoarthritis of first carpometacarpal joint, right hand: Secondary | ICD-10-CM | POA: Diagnosis not present

## 2023-03-26 DIAGNOSIS — G8929 Other chronic pain: Secondary | ICD-10-CM | POA: Diagnosis not present

## 2023-03-26 DIAGNOSIS — M65311 Trigger thumb, right thumb: Secondary | ICD-10-CM | POA: Diagnosis not present

## 2023-04-08 DIAGNOSIS — Z789 Other specified health status: Secondary | ICD-10-CM | POA: Diagnosis not present

## 2023-04-08 DIAGNOSIS — M79644 Pain in right finger(s): Secondary | ICD-10-CM | POA: Diagnosis not present

## 2023-04-13 DIAGNOSIS — H1131 Conjunctival hemorrhage, right eye: Secondary | ICD-10-CM | POA: Diagnosis not present

## 2023-04-13 DIAGNOSIS — G4709 Other insomnia: Secondary | ICD-10-CM | POA: Diagnosis not present

## 2023-04-20 DIAGNOSIS — E1165 Type 2 diabetes mellitus with hyperglycemia: Secondary | ICD-10-CM | POA: Diagnosis not present

## 2023-04-22 DIAGNOSIS — Z789 Other specified health status: Secondary | ICD-10-CM | POA: Diagnosis not present

## 2023-04-22 DIAGNOSIS — M79644 Pain in right finger(s): Secondary | ICD-10-CM | POA: Diagnosis not present

## 2023-06-19 DIAGNOSIS — L84 Corns and callosities: Secondary | ICD-10-CM | POA: Diagnosis not present

## 2023-06-19 DIAGNOSIS — Q828 Other specified congenital malformations of skin: Secondary | ICD-10-CM | POA: Diagnosis not present

## 2023-06-19 DIAGNOSIS — M79672 Pain in left foot: Secondary | ICD-10-CM | POA: Diagnosis not present

## 2023-07-27 DIAGNOSIS — Z794 Long term (current) use of insulin: Secondary | ICD-10-CM | POA: Diagnosis not present

## 2023-07-27 DIAGNOSIS — M810 Age-related osteoporosis without current pathological fracture: Secondary | ICD-10-CM | POA: Diagnosis not present

## 2023-07-27 DIAGNOSIS — E113293 Type 2 diabetes mellitus with mild nonproliferative diabetic retinopathy without macular edema, bilateral: Secondary | ICD-10-CM | POA: Diagnosis not present

## 2023-07-27 DIAGNOSIS — E7800 Pure hypercholesterolemia, unspecified: Secondary | ICD-10-CM | POA: Diagnosis not present

## 2023-07-27 DIAGNOSIS — I10 Essential (primary) hypertension: Secondary | ICD-10-CM | POA: Diagnosis not present

## 2023-08-16 ENCOUNTER — Other Ambulatory Visit: Payer: Self-pay

## 2023-08-16 ENCOUNTER — Emergency Department (HOSPITAL_BASED_OUTPATIENT_CLINIC_OR_DEPARTMENT_OTHER)

## 2023-08-16 ENCOUNTER — Emergency Department (HOSPITAL_BASED_OUTPATIENT_CLINIC_OR_DEPARTMENT_OTHER)
Admission: EM | Admit: 2023-08-16 | Discharge: 2023-08-16 | Disposition: A | Discharge status: Home / Self Care | Attending: Emergency Medicine | Admitting: Emergency Medicine

## 2023-08-16 ENCOUNTER — Encounter (HOSPITAL_BASED_OUTPATIENT_CLINIC_OR_DEPARTMENT_OTHER): Payer: Self-pay | Admitting: Emergency Medicine

## 2023-08-16 DIAGNOSIS — W25XXXA Contact with sharp glass, initial encounter: Secondary | ICD-10-CM | POA: Diagnosis not present

## 2023-08-16 DIAGNOSIS — I10 Essential (primary) hypertension: Secondary | ICD-10-CM | POA: Diagnosis not present

## 2023-08-16 DIAGNOSIS — E119 Type 2 diabetes mellitus without complications: Secondary | ICD-10-CM | POA: Diagnosis not present

## 2023-08-16 DIAGNOSIS — Z79899 Other long term (current) drug therapy: Secondary | ICD-10-CM | POA: Insufficient documentation

## 2023-08-16 DIAGNOSIS — Z7982 Long term (current) use of aspirin: Secondary | ICD-10-CM | POA: Insufficient documentation

## 2023-08-16 DIAGNOSIS — Z23 Encounter for immunization: Secondary | ICD-10-CM | POA: Diagnosis not present

## 2023-08-16 DIAGNOSIS — Z7984 Long term (current) use of oral hypoglycemic drugs: Secondary | ICD-10-CM | POA: Insufficient documentation

## 2023-08-16 DIAGNOSIS — S61211A Laceration without foreign body of left index finger without damage to nail, initial encounter: Secondary | ICD-10-CM | POA: Diagnosis not present

## 2023-08-16 MED ORDER — TETANUS-DIPHTH-ACELL PERTUSSIS 5-2.5-18.5 LF-MCG/0.5 IM SUSY
0.5000 mL | PREFILLED_SYRINGE | Freq: Once | INTRAMUSCULAR | Status: AC
  Administered 2023-08-16: 0.5 mL via INTRAMUSCULAR
  Filled 2023-08-16: qty 0.5

## 2023-08-16 MED ORDER — CEPHALEXIN 500 MG PO CAPS: 500.0000 mg | ORAL_CAPSULE | Freq: Three times a day (TID) | ORAL | 0 refills | Status: AC

## 2023-08-16 NOTE — ED Provider Notes (Signed)
 [place] [facility] Provider Note   CSN: [id number] Arrival date & time: 08/16/23  1218     History  Chief Complaint  Patient presents with   Finger Injury    Amy Foley is a 73 y.o. female history diabetes, hypertension and cataracts otherwise healthy presents for evaluation of laceration to the left index finger.  Patient reports that she was throwing away some old class III days ago when she suffered a small superficial laceration to the radial aspect of her middle phalanx of her left index finger.  She reports that she had a sharp pain initially but that improved with antibiotic ointment and rest.  Pain does not radiate she reports a mild pain only with endrange flexion of the finger.  She denies fever or chills, redness/swelling or any additional concerns.  HPI     Home Medications Prior to Admission medications   Medication Sig Start Date End Date Taking? Authorizing Provider  cephALEXin (KEFLEX) 500 MG capsule Take 1 capsule (500 mg total) by mouth 3 (three) times daily for 7 days. 08/16/23 08/23/23 Yes Hadassah Letters A, PA-C  aspirin 81 MG tablet Take 81 mg by mouth daily.    [provider]  Cholecalciferol (VITAMIN D-1000 MAX ST) 25 MCG (1000 UT) tablet Take by mouth.     [provider]  metoprolol succinate (TOPROL-XL) 25 MG 24 hr tablet Take 25 mg by mouth daily.    [provider]  Multiple Vitamin (MULTIVITAMIN) tablet Take 1 tablet by mouth daily.    [provider]  pioglitazone (ACTOS) 15 MG tablet Take 15 mg by mouth daily.    [provider]  ramipril (ALTACE) 2.5 MG capsule Altace 2.5 mg capsule    [provider]  sitaGLIPtin (JANUVIA) 100 MG tablet Take 100 mg by mouth daily.    [provider]      Allergies    Codeine, Metformin, and Tussionex pennkinetic er [hydrocod poli-chlorphe poli er]    Review of Systems   Review of Systems   Constitutional: Negative.  Negative for chills and fever.  Musculoskeletal:  Positive for arthralgias.  Skin:  Positive for wound.    Physical Exam Updated Vital Signs BP (!) 163/68 (BP Location: Right Arm)   Pulse 67   Temp 98 F (36.7 C) (Oral)   Resp 16   Ht 5\' 7"  (1.702 m)   Wt 62.1 kg   SpO2 100%   BMI 21.46 kg/m  Physical Exam Constitutional:      General: She is not in acute distress.    Appearance: Normal appearance. She is well-developed. She is not ill-appearing or diaphoretic.  HENT:     Head: Normocephalic and atraumatic.  Eyes:     General: Vision grossly intact. Gaze aligned appropriately.     Pupils: Pupils are equal, round, and reactive to light.  Neck:     Trachea: Trachea and phonation normal.  Pulmonary:     Effort: Pulmonary effort is normal. No respiratory distress.  Musculoskeletal:        General: Normal range of motion.     Cervical back: Normal range of motion.     Comments: Left index finger: Approximately 1 cm superficial laceration to the radial aspect of the middle phalanx of the left index finger.  No streaking.  No significant erythema.  No drainage or bleeding.  No deep tissue involvement or evidence of foreign body.  Patient reports some mild tenderness with palpation directly  over the laceration.  No tenderness elsewhere to the finger including the distal proximal phalanx.  Patient reports some pain with endrange flexion of the index finger.  No pain with extension.  She is good strength on exam.  Capillary refill and sensation intact.  Strong radial pulse without tachycardia.  Compartments soft.  No gross laxity to the ulnar or radial collateral ligaments of the DIP or PIP.  Please see attached picture.  Skin:    General: Skin is warm and dry.  Neurological:     Mental Status: She is alert.     GCS: GCS eye subscore is 4. GCS verbal subscore is 5. GCS motor subscore is 6.     Comments: Speech is clear and goal oriented, follows  commands Major Cranial nerves without deficit, no facial droop Moves extremities without ataxia, coordination intact  Psychiatric:        Behavior: Behavior normal.          ED Results / Procedures / Treatments   Labs (all labs ordered are listed, but only abnormal results are displayed) Labs Reviewed - No data to display  EKG None  Radiology DG Finger Index Left Result Date: 08/16/2023 CLINICAL DATA:  Small lac middle phalanx EXAM: LEFT INDEX FINGER 2+V COMPARISON:  None Available. FINDINGS: There is no evidence of fracture or dislocation. There is no evidence of arthropathy or other focal bone abnormality. No radiodense foreign body or subcutaneous gas. Soft tissues are unremarkable. IMPRESSION: Negative. Electronically Signed   By: Nicoletta Barrier M.D.   On: 08/16/2023 13:51    Procedures Procedures    Medications Ordered in ED Medications  Tdap (BOOSTRIX) injection 0.5 mL (0.5 mLs Intramuscular Given 08/16/23 1357)    ED Course/ Medical Decision Making/ A&P                                 Medical Decision Making 73 year old female presents for evaluation of laceration of the left index finger.  Differential for chief complaint includes but not limited to cellulitis, septic joint, tendinous injury, fracture, flexor tenosynovitis, felon, osteomyelitis.  On exam patient has a very superficial linear laceration to the left index finger.  Please see attached picture.  There is no significant erythema.  No swelling or drainage.  Patient has good range of motion of the finger with some increased pain endrange flexion.  Patient was unaware last time she got a tetanus shot and according to EMR it was in 2010.  We updated patient's tetanus today.  Patient would like to start oral antibiotics due to concern of development of infection considering she is diabetic.  Will start patient on Keflex 500 mg 3 times daily.  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  Overall the low suspicion for flexor tenosynovitis, septic joint or other emergent pathologies at this time.  Strict ER precautions were given to patient today and she stated understanding.  Amount and/or Complexity of Data Reviewed Radiology: ordered.    Details: I personally reviewed and interpreted three-view radiograph of the left index finger today.  I do not appreciate any obvious acute displaced fracture, dislocation or radiopaque foreign bodies on those films.  No free air visualized.  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  Risk Prescription drug management.  At this time there does not appear to be any evidence of an acute emergency medical condition and the patient appears stable for discharge with appropriate outpatient follow up. Diagnosis was discussed with patient who verbalizes understanding of care plan and is agreeable to discharge. I have discussed return precautions with patient who verbalizes understanding. Patient encouraged to follow-up with their PCP. All questions answered.  Patient's case discussed with Dr. Colonel Dears who agrees with plan to discharge with follow-up.   Note: Portions of this report may have been transcribed using voice recognition software. Every effort was made to ensure accuracy; however, inadvertent computerized transcription errors may still be present.         Final Clinical Impression(s) / ED Diagnoses Final diagnoses:  Laceration of left index finger without foreign body without damage to nail, initial encounter    Rx / DC Orders ED Discharge Orders          Ordered    cephALEXin (KEFLEX) 500 MG capsule  3 times daily        08/16/23 1425              Jennifer Moellers, PA-C 08/16/23 1506    Lowery Rue, DO 08/17/23 0801

## 2023-08-16 NOTE — ED Triage Notes (Signed)
 Pt states she cut her left finger on a piece of glass on Thursday.  Pt states it started swelling friday

## 2023-08-16 NOTE — Discharge Instructions (Addendum)
 At this time there does not appear to be the presence of an emergent medical condition, however there is always the potential for conditions to change. Please read and follow the below instructions.  Please return to the Emergency Department immediately for any new or worsening symptoms . Please be sure to follow up with your Primary Care Provider within one week regarding your visit today; please call their office to schedule an appointment even if you are feeling better for a follow-up visit. Please take your antibiotic Keflex as prescribed until complete to help with your symptoms.  Please drink enough water to avoid dehydration and get plenty of rest. Please call your hand doctor today to schedule a follow-up appointment this week.   Please read the additional information packets attached to your discharge summary.  Go to the nearest Emergency Department immediately if: You have fever or chills You have very bad swelling around the wound. Your pain suddenly gets worse and is very bad. You have painful lumps near the wound or on skin anywhere on your body. You have a red streak going away from your wound. The wound is on your hand or foot, and: You cannot move a finger or toe. Your fingers or toes look pale or bluish. You have any new or worsening of symptoms  Do not take your medicine if  develop an itchy rash, swelling in your mouth or lips, or difficulty breathing; call 911 and seek immediate emergency medical attention if this occurs.  You may review your lab tests and imaging results in their entirety on your MyChart account.  Please discuss all results of fully with your primary care provider and other specialist at your follow-up visit.  Note: Portions of this text may have been transcribed using voice recognition software. Every effort was made to ensure accuracy; however, inadvertent computerized transcription errors may still be present.

## 2023-08-25 DIAGNOSIS — S56191A Other injury of flexor muscle, fascia and tendon of right index finger at forearm level, initial encounter: Secondary | ICD-10-CM | POA: Diagnosis not present

## 2023-09-03 DIAGNOSIS — R531 Weakness: Secondary | ICD-10-CM | POA: Diagnosis not present

## 2023-09-03 DIAGNOSIS — M79642 Pain in left hand: Secondary | ICD-10-CM | POA: Diagnosis not present

## 2023-09-03 DIAGNOSIS — M25642 Stiffness of left hand, not elsewhere classified: Secondary | ICD-10-CM | POA: Diagnosis not present

## 2023-10-07 DIAGNOSIS — H524 Presbyopia: Secondary | ICD-10-CM | POA: Diagnosis not present

## 2023-10-07 DIAGNOSIS — H25013 Cortical age-related cataract, bilateral: Secondary | ICD-10-CM | POA: Diagnosis not present

## 2023-10-07 DIAGNOSIS — H2513 Age-related nuclear cataract, bilateral: Secondary | ICD-10-CM | POA: Diagnosis not present

## 2023-10-07 DIAGNOSIS — E113293 Type 2 diabetes mellitus with mild nonproliferative diabetic retinopathy without macular edema, bilateral: Secondary | ICD-10-CM | POA: Diagnosis not present

## 2023-10-07 DIAGNOSIS — H52203 Unspecified astigmatism, bilateral: Secondary | ICD-10-CM | POA: Diagnosis not present

## 2023-10-07 DIAGNOSIS — H5213 Myopia, bilateral: Secondary | ICD-10-CM | POA: Diagnosis not present

## 2023-10-07 DIAGNOSIS — H43393 Other vitreous opacities, bilateral: Secondary | ICD-10-CM | POA: Diagnosis not present

## 2023-10-07 DIAGNOSIS — H35043 Retinal micro-aneurysms, unspecified, bilateral: Secondary | ICD-10-CM | POA: Diagnosis not present

## 2023-10-07 DIAGNOSIS — H40003 Preglaucoma, unspecified, bilateral: Secondary | ICD-10-CM | POA: Diagnosis not present

## 2023-11-18 ENCOUNTER — Encounter: Payer: Self-pay | Admitting: Physician Assistant

## 2023-11-18 ENCOUNTER — Ambulatory Visit: Admitting: Physician Assistant

## 2023-11-18 VITALS — BP 115/66

## 2023-11-18 DIAGNOSIS — D229 Melanocytic nevi, unspecified: Secondary | ICD-10-CM

## 2023-11-18 DIAGNOSIS — L821 Other seborrheic keratosis: Secondary | ICD-10-CM | POA: Diagnosis not present

## 2023-11-18 DIAGNOSIS — Z1283 Encounter for screening for malignant neoplasm of skin: Secondary | ICD-10-CM | POA: Diagnosis not present

## 2023-11-18 DIAGNOSIS — D1801 Hemangioma of skin and subcutaneous tissue: Secondary | ICD-10-CM

## 2023-11-18 DIAGNOSIS — L814 Other melanin hyperpigmentation: Secondary | ICD-10-CM | POA: Diagnosis not present

## 2023-11-18 NOTE — Patient Instructions (Signed)

## 2023-11-18 NOTE — Progress Notes (Signed)
   New Patient Visit   Subjective  Amy Foley is a 73 y.o. female who presents for the following: Skin Cancer Screening and Upper Body Skin Exam - she has moles popping up all over and she would like them checked.   The patient presents for Upper Body Skin Exam (UBSE) for skin cancer screening and mole check. The patient has spots, moles and lesions to be evaluated, some may be new or changing and the patient may have concern these could be cancer.    The following portions of the chart were reviewed this encounter and updated as appropriate: medications, allergies, medical history  Review of Systems:  No other skin or systemic complaints except as noted in HPI or Assessment and Plan.  Objective  Well appearing patient in no apparent distress; mood and affect are within normal limits.  All skin waist up examined. Relevant physical exam findings are noted in the Assessment and Plan.    Assessment & Plan   SCREENING EXAM FOR SKIN CANCER   LENTIGINES   SEBORRHEIC KERATOSIS   CHERRY ANGIOMA   MULTIPLE BENIGN NEVI   DERMATOSIS PAPULOSA NIGRA   Skin cancer screening performed today  Lentigines, Seborrheic Keratoses, Hemangiomas - Benign normal skin lesions - Benign-appearing - Call for any changes  Melanocytic Nevi - Tan-brown and/or pink-flesh-colored symmetric macules and papules - Benign appearing on exam today - Observation - Call clinic for new or changing moles - Recommend daily use of broad spectrum spf 30+ sunscreen to sun-exposed areas.    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  Observation.  Call clinic for new or changing lesions.  Recommend daily use of broad spectrum spf 30+ sunscreen to sun-exposed areas.     Return if symptoms worsen or fail to improve, for every 2 years as she desires .  I, Roseline Hutchinson, CMA, am acting as scribe for Jayana Kotula K, PA-C  .   Documentation: I have reviewed the above documentation for accuracy and completeness, and I agree with the above.  Pasqualino Witherspoon K, PA-C

## 2023-12-10 ENCOUNTER — Ambulatory Visit: Admitting: Dermatology

## 2023-12-17 DIAGNOSIS — Z01419 Encounter for gynecological examination (general) (routine) without abnormal findings: Secondary | ICD-10-CM | POA: Diagnosis not present

## 2023-12-17 DIAGNOSIS — Z1231 Encounter for screening mammogram for malignant neoplasm of breast: Secondary | ICD-10-CM | POA: Diagnosis not present

## 2023-12-23 DIAGNOSIS — L84 Corns and callosities: Secondary | ICD-10-CM | POA: Diagnosis not present

## 2023-12-23 DIAGNOSIS — Q828 Other specified congenital malformations of skin: Secondary | ICD-10-CM | POA: Diagnosis not present

## 2023-12-23 DIAGNOSIS — M79672 Pain in left foot: Secondary | ICD-10-CM | POA: Diagnosis not present

## 2024-04-07 DIAGNOSIS — E113293 Type 2 diabetes mellitus with mild nonproliferative diabetic retinopathy without macular edema, bilateral: Secondary | ICD-10-CM | POA: Diagnosis not present

## 2024-04-07 DIAGNOSIS — I10 Essential (primary) hypertension: Secondary | ICD-10-CM | POA: Diagnosis not present

## 2024-04-07 DIAGNOSIS — Z5181 Encounter for therapeutic drug level monitoring: Secondary | ICD-10-CM | POA: Diagnosis not present

## 2024-04-07 DIAGNOSIS — E7800 Pure hypercholesterolemia, unspecified: Secondary | ICD-10-CM | POA: Diagnosis not present
